# Patient Record
Sex: Female | Born: 1960
Health system: Southern US, Community
[De-identification: ages and names within clinical notes are randomized; demographics above are authoritative.]

## PROBLEM LIST (undated history)

## (undated) DIAGNOSIS — G8929 Other chronic pain: Secondary | ICD-10-CM

## (undated) DIAGNOSIS — M329 Systemic lupus erythematosus, unspecified: Secondary | ICD-10-CM

## (undated) DIAGNOSIS — G43909 Migraine, unspecified, not intractable, without status migrainosus: Secondary | ICD-10-CM

## (undated) DIAGNOSIS — K219 Gastro-esophageal reflux disease without esophagitis: Secondary | ICD-10-CM

## (undated) DIAGNOSIS — F32A Depression, unspecified: Secondary | ICD-10-CM

## (undated) DIAGNOSIS — D649 Anemia, unspecified: Secondary | ICD-10-CM

## (undated) DIAGNOSIS — M797 Fibromyalgia: Secondary | ICD-10-CM

## (undated) DIAGNOSIS — Z87442 Personal history of urinary calculi: Secondary | ICD-10-CM

## (undated) DIAGNOSIS — F419 Anxiety disorder, unspecified: Secondary | ICD-10-CM

## (undated) DIAGNOSIS — IMO0002 Reserved for concepts with insufficient information to code with codable children: Secondary | ICD-10-CM

## (undated) DIAGNOSIS — F329 Major depressive disorder, single episode, unspecified: Secondary | ICD-10-CM

## (undated) DIAGNOSIS — R112 Nausea with vomiting, unspecified: Secondary | ICD-10-CM

## (undated) DIAGNOSIS — G4730 Sleep apnea, unspecified: Secondary | ICD-10-CM

## (undated) DIAGNOSIS — Z9889 Other specified postprocedural states: Secondary | ICD-10-CM

## (undated) HISTORY — DX: Other chronic pain: G89.29

## (undated) HISTORY — DX: Depression, unspecified: F32.A

## (undated) HISTORY — PX: CARDIAC CATHETERIZATION: SHX172

## (undated) HISTORY — PX: APPENDECTOMY: SHX54

## (undated) HISTORY — DX: Migraine, unspecified, not intractable, without status migrainosus: G43.909

## (undated) HISTORY — DX: Fibromyalgia: M79.7

## (undated) HISTORY — PX: TUBAL LIGATION: SHX77

## (undated) HISTORY — PX: OTHER SURGICAL HISTORY: SHX169

## (undated) HISTORY — PX: CHOLECYSTECTOMY: SHX55

## (undated) HISTORY — DX: Reserved for concepts with insufficient information to code with codable children: IMO0002

## (undated) HISTORY — PX: BREAST CYST ASPIRATION: SHX578

## (undated) HISTORY — PX: TONSILLECTOMY: SUR1361

## (undated) HISTORY — DX: Major depressive disorder, single episode, unspecified: F32.9

## (undated) HISTORY — DX: Systemic lupus erythematosus, unspecified: M32.9

---

## 1999-04-21 ENCOUNTER — Inpatient Hospital Stay (HOSPITAL_COMMUNITY): Admission: AD | Admit: 1999-04-21 | Discharge: 1999-04-22 | Payer: Self-pay | Admitting: Internal Medicine

## 2001-02-07 ENCOUNTER — Emergency Department (HOSPITAL_COMMUNITY): Admission: EM | Admit: 2001-02-07 | Discharge: 2001-02-07 | Payer: Self-pay | Admitting: Emergency Medicine

## 2001-05-13 ENCOUNTER — Ambulatory Visit (HOSPITAL_COMMUNITY): Admission: RE | Admit: 2001-05-13 | Discharge: 2001-05-13 | Payer: Self-pay | Admitting: Internal Medicine

## 2001-05-13 ENCOUNTER — Encounter: Payer: Self-pay | Admitting: Internal Medicine

## 2001-05-17 ENCOUNTER — Encounter: Admission: RE | Admit: 2001-05-17 | Discharge: 2001-08-15 | Payer: Self-pay

## 2001-05-26 ENCOUNTER — Encounter (HOSPITAL_COMMUNITY): Admission: RE | Admit: 2001-05-26 | Discharge: 2001-06-25 | Payer: Self-pay | Admitting: Neurosurgery

## 2001-06-07 ENCOUNTER — Encounter: Payer: Self-pay | Admitting: Internal Medicine

## 2001-06-07 ENCOUNTER — Ambulatory Visit (HOSPITAL_COMMUNITY): Admission: RE | Admit: 2001-06-07 | Discharge: 2001-06-07 | Payer: Self-pay | Admitting: Internal Medicine

## 2001-06-28 ENCOUNTER — Encounter (HOSPITAL_COMMUNITY): Admission: RE | Admit: 2001-06-28 | Discharge: 2001-07-28 | Payer: Self-pay | Admitting: Neurosurgery

## 2001-09-12 ENCOUNTER — Encounter: Admission: RE | Admit: 2001-09-12 | Discharge: 2001-12-11 | Payer: Self-pay

## 2001-11-16 ENCOUNTER — Encounter: Payer: Self-pay | Admitting: Internal Medicine

## 2001-11-16 ENCOUNTER — Ambulatory Visit (HOSPITAL_COMMUNITY): Admission: RE | Admit: 2001-11-16 | Discharge: 2001-11-16 | Payer: Self-pay | Admitting: Cardiology

## 2001-11-23 ENCOUNTER — Encounter: Payer: Self-pay | Admitting: Internal Medicine

## 2001-11-23 ENCOUNTER — Ambulatory Visit (HOSPITAL_COMMUNITY): Admission: RE | Admit: 2001-11-23 | Discharge: 2001-11-23 | Payer: Self-pay | Admitting: Internal Medicine

## 2002-01-11 ENCOUNTER — Encounter: Admission: RE | Admit: 2002-01-11 | Discharge: 2002-04-11 | Payer: Self-pay

## 2002-02-10 ENCOUNTER — Ambulatory Visit (HOSPITAL_COMMUNITY): Admission: RE | Admit: 2002-02-10 | Discharge: 2002-02-10 | Payer: Self-pay | Admitting: Internal Medicine

## 2002-03-15 ENCOUNTER — Ambulatory Visit (HOSPITAL_COMMUNITY): Admission: RE | Admit: 2002-03-15 | Discharge: 2002-03-15 | Payer: Self-pay | Admitting: Internal Medicine

## 2002-03-15 ENCOUNTER — Encounter: Payer: Self-pay | Admitting: Internal Medicine

## 2002-03-22 ENCOUNTER — Encounter (INDEPENDENT_AMBULATORY_CARE_PROVIDER_SITE_OTHER): Payer: Self-pay | Admitting: Internal Medicine

## 2002-03-22 ENCOUNTER — Ambulatory Visit (HOSPITAL_COMMUNITY): Admission: RE | Admit: 2002-03-22 | Discharge: 2002-03-22 | Payer: Self-pay | Admitting: Internal Medicine

## 2002-05-27 ENCOUNTER — Emergency Department (HOSPITAL_COMMUNITY): Admission: EM | Admit: 2002-05-27 | Discharge: 2002-05-27 | Payer: Self-pay | Admitting: Emergency Medicine

## 2002-05-27 ENCOUNTER — Encounter: Payer: Self-pay | Admitting: Emergency Medicine

## 2002-07-31 ENCOUNTER — Encounter: Payer: Self-pay | Admitting: *Deleted

## 2002-07-31 ENCOUNTER — Ambulatory Visit (HOSPITAL_COMMUNITY): Admission: RE | Admit: 2002-07-31 | Discharge: 2002-07-31 | Payer: Self-pay | Admitting: *Deleted

## 2002-08-04 ENCOUNTER — Ambulatory Visit (HOSPITAL_COMMUNITY): Admission: RE | Admit: 2002-08-04 | Discharge: 2002-08-04 | Payer: Self-pay | Admitting: General Surgery

## 2002-11-15 ENCOUNTER — Ambulatory Visit (HOSPITAL_COMMUNITY): Admission: RE | Admit: 2002-11-15 | Discharge: 2002-11-15 | Payer: Self-pay | Admitting: Internal Medicine

## 2002-11-15 ENCOUNTER — Encounter: Payer: Self-pay | Admitting: Internal Medicine

## 2002-12-26 ENCOUNTER — Encounter: Payer: Self-pay | Admitting: Family Medicine

## 2002-12-26 ENCOUNTER — Ambulatory Visit (HOSPITAL_COMMUNITY): Admission: RE | Admit: 2002-12-26 | Discharge: 2002-12-26 | Payer: Self-pay | Admitting: Family Medicine

## 2003-01-29 ENCOUNTER — Encounter: Payer: Self-pay | Admitting: Internal Medicine

## 2003-01-29 ENCOUNTER — Ambulatory Visit (HOSPITAL_COMMUNITY): Admission: RE | Admit: 2003-01-29 | Discharge: 2003-01-29 | Payer: Self-pay | Admitting: Internal Medicine

## 2003-01-30 ENCOUNTER — Ambulatory Visit (HOSPITAL_COMMUNITY): Admission: RE | Admit: 2003-01-30 | Discharge: 2003-01-30 | Payer: Self-pay | Admitting: Internal Medicine

## 2003-01-30 ENCOUNTER — Encounter: Payer: Self-pay | Admitting: Internal Medicine

## 2003-10-29 ENCOUNTER — Ambulatory Visit (HOSPITAL_COMMUNITY): Admission: RE | Admit: 2003-10-29 | Discharge: 2003-10-29 | Payer: Self-pay | Admitting: *Deleted

## 2004-02-20 ENCOUNTER — Ambulatory Visit: Payer: Self-pay | Admitting: Cardiology

## 2004-02-20 ENCOUNTER — Ambulatory Visit (HOSPITAL_COMMUNITY): Admission: RE | Admit: 2004-02-20 | Discharge: 2004-02-20 | Payer: Self-pay | Admitting: Cardiology

## 2004-03-24 ENCOUNTER — Ambulatory Visit: Payer: Self-pay | Admitting: Cardiology

## 2004-12-23 ENCOUNTER — Ambulatory Visit: Payer: Self-pay | Admitting: Internal Medicine

## 2004-12-24 ENCOUNTER — Ambulatory Visit (HOSPITAL_COMMUNITY): Admission: RE | Admit: 2004-12-24 | Discharge: 2004-12-24 | Payer: Self-pay | Admitting: Internal Medicine

## 2005-01-15 ENCOUNTER — Ambulatory Visit (HOSPITAL_COMMUNITY): Admission: RE | Admit: 2005-01-15 | Discharge: 2005-01-15 | Payer: Self-pay | Admitting: Internal Medicine

## 2005-01-15 ENCOUNTER — Ambulatory Visit: Payer: Self-pay | Admitting: Internal Medicine

## 2005-07-16 ENCOUNTER — Ambulatory Visit (HOSPITAL_COMMUNITY): Admission: RE | Admit: 2005-07-16 | Discharge: 2005-07-16 | Payer: Self-pay | Admitting: *Deleted

## 2005-08-12 ENCOUNTER — Ambulatory Visit (HOSPITAL_COMMUNITY): Admission: RE | Admit: 2005-08-12 | Discharge: 2005-08-12 | Payer: Self-pay | Admitting: *Deleted

## 2007-01-28 ENCOUNTER — Ambulatory Visit (HOSPITAL_COMMUNITY): Admission: RE | Admit: 2007-01-28 | Discharge: 2007-01-28 | Payer: Self-pay | Admitting: Internal Medicine

## 2007-01-28 ENCOUNTER — Encounter: Payer: Self-pay | Admitting: Orthopedic Surgery

## 2007-02-21 ENCOUNTER — Ambulatory Visit (HOSPITAL_COMMUNITY): Admission: RE | Admit: 2007-02-21 | Discharge: 2007-02-21 | Payer: Self-pay | Admitting: Obstetrics & Gynecology

## 2007-03-16 ENCOUNTER — Ambulatory Visit: Payer: Self-pay | Admitting: Orthopedic Surgery

## 2007-03-16 DIAGNOSIS — M25569 Pain in unspecified knee: Secondary | ICD-10-CM | POA: Insufficient documentation

## 2007-03-16 DIAGNOSIS — IMO0001 Reserved for inherently not codable concepts without codable children: Secondary | ICD-10-CM | POA: Insufficient documentation

## 2007-03-16 DIAGNOSIS — M549 Dorsalgia, unspecified: Secondary | ICD-10-CM | POA: Insufficient documentation

## 2007-03-21 ENCOUNTER — Encounter: Payer: Self-pay | Admitting: Orthopedic Surgery

## 2007-03-21 ENCOUNTER — Encounter (HOSPITAL_COMMUNITY): Admission: RE | Admit: 2007-03-21 | Discharge: 2007-04-13 | Payer: Self-pay | Admitting: Orthopedic Surgery

## 2007-04-20 ENCOUNTER — Encounter (HOSPITAL_COMMUNITY): Admission: RE | Admit: 2007-04-20 | Discharge: 2007-05-20 | Payer: Self-pay | Admitting: Orthopedic Surgery

## 2007-05-04 ENCOUNTER — Encounter: Payer: Self-pay | Admitting: Orthopedic Surgery

## 2007-05-12 ENCOUNTER — Encounter: Payer: Self-pay | Admitting: *Deleted

## 2007-05-13 ENCOUNTER — Ambulatory Visit (HOSPITAL_COMMUNITY): Admission: RE | Admit: 2007-05-13 | Discharge: 2007-05-13 | Payer: Self-pay | Admitting: *Deleted

## 2007-08-24 ENCOUNTER — Ambulatory Visit (HOSPITAL_COMMUNITY): Admission: RE | Admit: 2007-08-24 | Discharge: 2007-08-24 | Payer: Self-pay | Admitting: Internal Medicine

## 2009-06-12 ENCOUNTER — Ambulatory Visit (HOSPITAL_COMMUNITY): Admission: RE | Admit: 2009-06-12 | Discharge: 2009-06-12 | Payer: Self-pay | Admitting: Obstetrics & Gynecology

## 2010-05-04 ENCOUNTER — Encounter: Payer: Self-pay | Admitting: Family Medicine

## 2010-05-04 ENCOUNTER — Encounter: Payer: Self-pay | Admitting: Obstetrics & Gynecology

## 2010-08-26 NOTE — Cardiovascular Report (Signed)
[name], [name]               ACCOUNT NO.:  [id number]   MEDICAL RECORD NO.:  [id number]          PATIENT TYPE:  OIB   LOCATION:  2867                         FACILITY:  [facility]   PHYSICIAN:  Darlin Priestly, MD  DATE OF BIRTH:  [birth year]   DATE OF PROCEDURE:  05/13/2007  DATE OF DISCHARGE:  05/13/2007                            CARDIAC CATHETERIZATION   PROCEDURE:  1. Left heart catheterization.  2. Coronary angiography.  3. Left ventriculogram.   SURGEON:  Darlin Priestly, MD   COMPLICATIONS:  None.   INDICATIONS:  [name] is a 50 year old female patient of Dr. Kem Boroughs and Dr. Madelin Rear. Fusco with a history of ongoing chest pain  and shortness of breath.  She has been under a considerable amount of  depression.  She did undergo a Cardiolite scan suggesting intraseptal  ischemia.  She is now referred for cardiac catheterization to rule out  significant CAD.   DESCRIPTION OF PROCEDURE:  After giving informed consent the patient was  brought to the cardiac cath lab.  The right groin was shaved, prepped  and draped in the usual sterile fashion.  Routine monitoring  established.  Using modified Seldinger technique a #6 anterior sheath  was inserted in the femoral artery.  A 6 French diagnostic catheter was  performed diagnostic angiography.   Left main is a large vessel disease and no significant disease.   The LAD is a large vessel coursing at the apex and gives off one  diagonal branch.  The LAD has no significant disease.   First diagonal was a small to medium sized vessel with no significant  disease.   Left circumflex was a medium sized vessel coursing in the AV groove and  gives off one obtuse marginal branch.  The AV circumflex has no  significant disease.   First OM is a medium sized vessel which bifurcates in the segment with  no significant disease.   The right coronary is a large vessel which was dominant.  It gives off a  PDA  posterolateral branch.  There was no significant disease in the RCA  or posterolateral branch.   Left ventriculogram reveals a preserved EF of 60%.   HEMODYNAMICS:  Systemic arterial pressure 105/62, LV systemic pressure  105/6, LVEDP of 10.   CONCLUSION:  No significant coronary artery disease.  Normal left  ventricular systolic function.      Darlin Priestly, MD  Electronically Signed     RHM/MEDQ  D:  05/13/2007  T:  05/13/2007  Job:  [id number]   cc:   Kem Boroughs, Dr

## 2010-08-29 NOTE — Op Note (Signed)
[name], [name]                           ACCOUNT NO.:  [id number]   MEDICAL RECORD NO.:  [id number]                   PATIENT TYPE:   LOCATION:                                       FACILITY:   PHYSICIAN:  Lionel December, M.D.                 DATE OF BIRTH:   DATE OF PROCEDURE:  02/10/2002  DATE OF DISCHARGE:                                 OPERATIVE REPORT   PROCEDURE:  Esophagogastroduodenoscopy.   ENDOSCOPIST:  Lionel December, M.D.   INDICATIONS:  This patient is a 50 year old Caucasian female who has a 3-  month history of intermittent epigastric pain associated with nausea and  vomiting.  Workup for gallbladder disease has been negative.  She has not  responded to PPI.  She does have throat symptoms.  She was treated for H.  pylori 1 year ago.   She is undergoing diagnostic EGD.  The procedure and risks were reviewed  with the patient and informed consent was obtained.   PREOPERATIVE MEDICATIONS:  Cetacaine spray for pharyngeal topical  anesthesia, Fentanyl 50 mcg IV and Versed 6 mg IV in divided dose.   INSTRUMENT:  Olympus video system.   FINDINGS:  Procedure performed in endoscopy suite.  The patient's vital  signs and O2 saturation were monitored during the procedure and remained  stable.  The patient was placed in the left lateral recumbent position and  endoscope was passed via the oropharynx without any difficulty into the  esophagus.   ESOPHAGUS:  Mucosa of the esophagus was normal throughout.  Squamocolumnar  junction was wavy.  There was a small sliding hiatal hernia.   STOMACH:  It was empty and distended very well with insufflation.  The folds  of the proximal stomach were normal.  Examination of the mucosa, body,  antrum, pyloric channel, as well as angularis and fundus were normal.   DUODENUM:  Examination of the bulb, second and third part of the duodenum  was also normal.   Endoscope was withdrawn.  The patient tolerated the procedure well.   FINAL DIAGNOSES:  1. Small sliding hiatal hernia at gastroesophageal junction.  2. No evidence of peptic ulcer disease or gastritis.   RECOMMENDATIONS:  1. She will continue antireflux measures as before.  We will drop her Nexium     to 40 mg q.a.m. and try her on     Carafate 2 gm at bedtime.  2. I will ask her to keep a written record as to the frequency of these     episodes and she will return for follow up visit 1 month from now.                                               Lionel December, M.D.  NR/MEDQ  D:  02/10/2002  T:  02/10/2002  Job:  [id number]   cc:   Madelin Rear. Sherwood Gambler, M.D.  [address]  [place]  [place] [address]  Fax: [phone]

## 2010-08-29 NOTE — Consult Note (Signed)
[name], [name]                         ACCOUNT NO.:  [id number]   MEDICAL RECORD NO.:  [id number]                   PATIENT TYPE:   LOCATION:                                       FACILITY:   PHYSICIAN:  Zachary George, DO                      DATE OF BIRTH:  Feb 23, 1961   DATE OF CONSULTATION:  04/11/2002  DATE OF DISCHARGE:                                   CONSULTATION   HISTORY OF PRESENT ILLNESS:  The patient returns to the clinic today for  reevaluation.  She was last seen on March 13, 2002.  In the interim, her  pain has decreased significantly.  She is doing much better in that regard.  Her pain today is a 3/10 on a subjective scale.  I have prescribed Klonopin  for sleep, which she states has been helpful, although she continues to only  sleep approximately five hours per night.  She follows up with per primary  care Kasen Adduci tomorrow.  Also in the interim, she has undergone MRI of her  cervical spine and brain.  I do not have the results of this, but she states  that the cervical spine MRI showed some bulging disks.  However, she is not  complaining of any neck pain.  She states that [employer]. Fusco, M.D., had  considered physical therapy for her neck, but she does not feel like she  needs it.  She has also undergone electrodiagnostic studies of bilateral  lower extremities in the interim.  I do not have results of these studies,  however, the patient states that there was nothing remarkable noted on nerve  conduction studies or EMG.  She states that Dr. Sherwood Gambler wants to refer her  back to the neurologist to evaluate the MRI of her brain.  I reviewed the  health and history form and 14-point review of systems.  She continues on  Klonopin 0.5 mg q.h.s. and Tylox typically every two to three days, however,  she does not feel like she needs this any further.  She continues with her  home exercise program.   PHYSICAL EXAMINATION:  GENERAL APPEARANCE:  A healthy female in  no acute  distress.  VITAL SIGNS:  Blood pressure 121/60, pulse 71, respirations 20, O2  saturation 99% on room air.  NEUROLOGIC:  Intact in the upper and lower extremities, including motor,  sensory, and reflexes.  Gait is normal.   IMPRESSION:  1. Fibromyalgia syndrome exacerbation, resolved.  2. History of lupus.   PLAN:  1. Will continue with Klonopin, but increase the dose to 1 mg one p.o.     q.h.s. as needed for one more month.  At that time, would consider     switching back to a low-dose tricyclic antidepressant.  Would chose     Pamelor and monitor for side effects.  The patient was on Elavil  previously, which helped her significantly, but also caused her to gain     weight.  Pamelor has a lower side effect profile in that regard.  2. Continue home exercise program.  3. Follow up with primary care Naliah Eddington.  4. The patient is to return to the clinic on an as needed basis.   The patient was educated on the above findings and recommendations and  understands.  There were no barriers to communication.                                               Zachary George, DO    JW/MEDQ  D:  04/11/2002  T:  04/11/2002  Job:  [id number]   cc:   Madelin Rear. Sherwood Gambler, M.D.  [address]  [place]  [place] [address]  Fax: [phone]

## 2010-08-29 NOTE — Consult Note (Signed)
   [name], [name]                         ACCOUNT NO.:  [id number]   MEDICAL RECORD NO.:  [id number]                   PATIENT TYPE:  REC   LOCATION:  [facility]                                  FACILITY:  [facility]   PHYSICIAN:  Sondra Come, D.O.                 DATE OF BIRTH:  1961/03/12   DATE OF CONSULTATION:  01/12/2002  DATE OF DISCHARGE:                                   CONSULTATION   REASON FOR CONSULTATION:  The patient returns to clinic today for re-  evaluation.  She was last seen on 09/19/01.  She states she had been doing  very well in terms of her overall pain management until she went on a trip  to [place] approximately one month ago.  She states that she had a very  difficult time sleeping while she was away, and has had a flare-up of her  fibromyalgic symptoms.  Initially, she had pain diffusely involving the  upper back, hips, legs, and knees.                                               Sondra Come, D.O.    JJW/MEDQ  D:  01/12/2002  T:  01/13/2002  Job:  [id number]

## 2010-08-29 NOTE — Consult Note (Signed)
[facility]  Patient:    ANAIRA, SEAY Visit Number: [id number] MRN: [id number]          Service Type: PMG Location: [facility] Attending Physician:  Sondra Come Dictated by:   Sondra Come, D.O. Proc. Date: 09/19/01 Admit Date:  09/12/2001   CC:         Elfredia Nevins, M.D.   Consultation Report  HISTORY:  The patient returns to the clinic today scheduled for reevaluation. She was last seen on July 20, 2001. She continues to do quite well in terms of her fibromyalgic pain. Currently her pain is 3/10 on a subjective scale. She states that over the last few weeks her hips, legs and back have been hurting more significantly. She has been under a significant amount of stress over the last week or so as her brother-in-law apparently died as a result of an overdose of OxyContin. She has been dealing with that and other stressful family issues. Her function and quality of life indices, however,  have improved significantly. Her sleep is good on Elavil 25 mg daily. She continues also on Neurontin 300 mg t.i.d., Humibid DM b.i.d. and Ultracet as needed. I gave her 30 Ultracet samples at last visit and she had used those sparingly and in fact just ran out. I reviewed the health and history form and 14 point review of systems.  PHYSICAL EXAMINATION:  Reveals a healthy female in no acute distress. Blood pressure 117/56, pulse 85, respirations 16, O2 saturations 99% on room air. Palpatory examination reveals minimal tenderness to palpation, bilateral gluteal muscles. She does have some mild tenderness over the left greater trochanter. Minimal lumbar paraspinal tenderness. No neurologic deficits noted in the upper and lower extremities including motor, sensory and reflexes at this time.  IMPRESSION:  1. Fibromyalgia syndrome.  2. History of lupus.  3. Nonrestorative sleep disorder improved.  PLAN:  1. Ms. Cage and I discussed further treatment options  at this point.     I instructed her to continue Elavil 25 mg at bedtime for now. Will     consider transitioning her to another agent over time as there is some     concern for weight gain.  2. Continue Neurontin for now but consider weaning the patient from this     to determine whether or not she still needs to be on it. I have instructed     her to discontinue this slowly over a week prior to her next visit to see     if she needs to continue. Would consider switching her to Zonegran     which would likely give her the same benefit with potential for weight     loss.  3. Continue Humibid DM.  4. Continue Ultracet 1-2 p.o. t.i.d. p.r.n. #60 with one refill.  5. Would continue home exercise program especially low to nonimpact aerobic     activity.  6. The patient to return to clinic in one month for reevaluation.  The patient was educated on the above findings and recommendations and understands. There were no barriers to communication. Dictated by:   Sondra Come, D.O. Attending Physician:  Sondra Come DD:  09/19/01 TD:  09/21/01 Job: [id number]

## 2010-08-29 NOTE — Consult Note (Signed)
[name], [name]                         ACCOUNT NO.:  [id number]   MEDICAL RECORD NO.:  [id number]                   PATIENT TYPE:  OUT   LOCATION:  RAD                                  FACILITY:  [facility]   PHYSICIAN:  Sondra Come, D.O.                 DATE OF BIRTH:  04-03-1961   DATE OF CONSULTATION:  03/13/2002  DATE OF DISCHARGE:                                   CONSULTATION   REASON FOR CONSULTATION:  The patient returns to the clinic today as  scheduled for reevaluation.  She was last seen on January 12, 2002.  In the  interim, she had been doing fairly well until about two weeks ago when she  had a flare-up of her fibromyalgia symptoms.  She notes increased stress  over the holiday.  Her pain is increased diffusely but mainly involving the  mid to low back and lower extremities with an intermittent with burning-type  pain.  She was evaluated by her primary care physician last week who  performed blood work including B12, folate, and a sedimentation rate which  according to the patient were normal.  He also has requested MRI of her  brain and cervical spine per patient's report.  These are to be done in the  next week or two.  She also was referred for electrodiagnostic studies of  her lower extremities.  She was given a prescription for Tylox, which she  takes the edge off, but her face feel somewhat numb.  The Ultram, which she  previously took, is no longer working for her.  She feels like her sleep is  fairly good with Ambien at bed time; however, she does not feel well rested.  She continues exercise including stretching and walking.   Her lower extremity symptoms seemed to have improved somewhat compared to  last week per her report.  Her pain today is an 8 over 10 on subjective  scale.  Functional quality of life indices have declined.  She is planning  on a trip to [place] in the next few weeks and I think there is a lot of  stress in this regard too as  traveling tends to disrupt her sleep and she is  somewhat worried about this.  She continues on Humibid DM without any  significant improvement.  She also does not note any significant improvement  with Zanaflex which she takes just as needed.  She has started Carafate and  Nexium per gastroenterology secondary to a hiatal hernia with questionable  ulcer.  I reviewed health and history form and 14-point review of systems.   PHYSICAL EXAMINATION:  GENERAL:  A healthy female in no acute distress.  VITAL SIGNS:  Blood pressure 119/71, pulse 99, respirations 18.  O2  saturations 100% on room air.  BACK:  Examination of the back reveals level pelvis without scoliosis.  There is tenderness to palpation  in the thoracolumbar paraspinal muscles  reproducing the patient's back pain symptoms.  Manual muscle testing is 5/5  bilateral upper and lower extremities.  Sensory examination is intact to  light touch bilateral upper and lower extremities.  Muscle stretch reflexes  are 2+/4 right patellar, bilateral medial hamstrings, and bilateral Achilles  and it is 3+/4 left patellar.  There is no ankle clonus noted.  Straight leg  raises, faber and Lhermitte's signs are negative.  Minimal tenderness to  palpation at the medial knees bilaterally.  There is some tenderness over  the greater trochanters and gluteal muscles bilaterally as well.   IMPRESSION:  1. Fibromyalgia syndrome.  2. History of lupus.  3. Mild hyperreflexia left patella reflex of uncertain etiology.   PLAN:  1. Discontinue Ambien and begin Klonopin 0.5 mg 1 p.o. q.h.s. p.r.n. #30     without refills.  2. Discontinue Zanaflex.  3. Await MRI of brain and C-spine which is appropriate to rule out any upper     motor neuron problem.  4. It would be reasonable to postpone electrodiagnostic studies as these     would not show any upper motor neuron lesion, and based on the patient's     examination today, I do not feel like she has a  peripheral neuropathy or     radiculopathy which would be detected by nerve conduction studies and     EMG.  5. Discontinue Humibid.  6. Continue home exercise program.  7. Offered reassurance as I feel this is a fibromyalgia flare-up and should     likely respond to conservative measures, although again I think it is     reasonable to proceed with MRIs to rule out any upper motor neuron     component.  8. The patient should return to the clinic in one month for reevaluation.   DISPOSITION:  The patient was educated in the above findings,  recommendations, and understands.  There were no barriers to communication.                                               Sondra Come, D.O.    JJW/MEDQ  D:  03/13/2002  T:  03/13/2002  Job:  [id number]   cc:   Madelin Rear. Sherwood Gambler, M.D.  [address]  [place]  [place] [address]  Fax: [phone]

## 2010-08-29 NOTE — Consult Note (Signed)
NAMEVYLA, PINT                         ACCOUNT NO.:  [id number]   MEDICAL RECORD NO.:  [id number]                   PATIENT TYPE:  REC   LOCATION:  [facility]                                  FACILITY:  [facility]   PHYSICIAN:  Sondra Come, D.O.                 DATE OF BIRTH:  1960/10/07   DATE OF CONSULTATION:  01/12/2002  DATE OF DISCHARGE:                                   CONSULTATION   REASON FOR CONSULTATION:  The patient returns to clinic today as scheduled  for re-evaluation.  She was last seen on 09/19/01.  She had been doing very  well with her overall pain management until she took a trip to [place]  approximately one month ago.  She states she had a very difficult time  sleeping while she was on that trip, and noticed a flare-up in her pain  diffusely involving her upper back, hips, legs, and knees.  She has been  diagnosed with lupus by her primary care physician who has given her a  Decadron shot and steroid taper in the interim, which she states has helped  her upper back pain, but she continues to have pain in her hips, legs, and  knees which she describes as achy and burning.  Her sleep is still very poor  despite trazodone, which she state helps her fall asleep, but she wakes up  after a few hours.  She has also noticed increased nightmares over the past  few months, and I am wondering whether or not this is related to the  trazodone.  She continues to take Ultracet up to two pills t.i.d. which does  not seem to be helping as much as it previously had.  She also continues on  Humibid DM which she feels is helping her symptoms to some degree.  This is  likely from the dextromethorphan component.  I reviewed health and history  form and 14 point review of systems.  The patient denies any numbness or  paresthesias in the upper and lower extremities.  She does admit to a cold  feeling of her feet bilaterally.   PHYSICAL EXAMINATION:  GENERAL:  A healthy female in no acute  distress.  VITAL SIGNS:  Blood pressure 121/55, pulse 78, respirations 20, O2  saturation 98% on room air.  NEUROLOGIC:  Manual muscle testing is 5/5 bilateral upper and lower  extremities.  Sensory examination is intact to light touch bilateral upper  and lower extremities.  Muscle stretch reflexes are 2+/4 bilateral biceps,  triceps, brachial radialis, pronator teres, patellar, medial hamstrings, and  Achilles.  Palpatory examination reveals mild tenderness to palpation  bilateral gluteal muscles, as well as the lumbar paraspinal muscles.  There  is mild tenderness over the medial knees.   IMPRESSION:  1. Fibromyalgia syndrome.  2. History of lupus.  3. Non-restorative sleep disorder.   PLAN:  1. I will  begin Ambien 10 mg 1/2 to 1 p.o. q.h.s. p.r.n. insomnia #30 with     one refill.  The patient was educated on the use of this medication.  I     think with restorative sleep capacity, her symptoms should begin to     subside.  2. I will prescribe Lidoderm 5% apply up to 12 hours per day #30 with one     refill.  The patient was also given two sample patches.  3. Change Ultracet to Ultram 50 mg one or two p.o. q.i.d. p.r.n. #100 with     one refill.  The patient states that she believes the Ultram seemed to     help her more then the Ultracet.  4. Continue Humibid DM one p.o. b.i.d. #60 with one refill.  5. Await aquatic therapy which also should help in terms of overall pain     management.  6. The patient is to return to clinic in two months or sooner as needed.   The patient was educated on the above findings and recommendations and  understands.  There were no barriers to communication.                                               Sondra Come, D.O.    JJW/MEDQ  D:  01/12/2002  T:  01/13/2002  Job:  [id number]

## 2010-08-29 NOTE — Op Note (Signed)
[name], [name]                         ACCOUNT NO.:  [id number]   MEDICAL RECORD NO.:  [id number]                   PATIENT TYPE:  AMB   LOCATION:  DAY                                  FACILITY:  [facility]   PHYSICIAN:  Dalia Heading, M.D.               DATE OF BIRTH:  1960-08-16   DATE OF PROCEDURE:  08/04/2002  DATE OF DISCHARGE:                                 OPERATIVE REPORT   PREOPERATIVE DIAGNOSIS:  Cholecystectomy, cholelithiasis.   POSTOPERATIVE DIAGNOSIS:  Cholecystectomy, cholelithiasis.   PROCEDURE:  Laparoscopic cholecystectomy.   SURGEON:  Dalia Heading, M.D.   ASSISTANT:  Bernerd Limbo. Leona Carry, M.D.   ANESTHESIA:  General endotracheal.   INDICATIONS FOR PROCEDURE:  The patient is a 50 year old white female who  was referred for evaluation and treatment of biliary colic secondary to  cholelithiasis.  The risks and benefits of the procedure, including  bleeding, infection, hepatobiliary injury, and the possibility of an open  procedure were fully explained to the patient who gave informed consent.   DESCRIPTION OF PROCEDURE:  The patient was placed in the supine position.  After induction of general endotracheal anesthesia, the abdomen was prepped  and draped using the usual sterile technique with Betadine.  Surgical site  confirmation was performed.   An infraumbilical incision was made down to the fascia.  A Veress needle was  introduced into the abdominal cavity, and confirmation of placement was done  using the saline drop test.  The abdomen was then insufflated to 16 mmHg  pressure.  An 11-mm trocar was introduced into the abdominal cavity under  direct visualization without difficulty.  The patient was placed in reverse  Trendelenburg position, and an additional 11-mm trocar was placed in the  epigastric region and 5-mm trocars were placed in the right upper quadrant  and right flank regions.  The liver was inspected and noted to be within  normal  limits.  The gallbladder was retracted superiorly and laterally.  The  dissection was begun around the infundibulum of the gallbladder.  The cystic  duct was first identified.  It's juncture to the infundibulum was fully  identified.  Endoclips were placed proximally and distally on the cystic  duct, and the cystic duct was divided.  This was likewise done on the cystic  artery.  The gallbladder was then freed away from the gallbladder fossa  using Bovie electrocautery.  The gallbladder was delivered through the  epigastric trocar site without difficulty.  The gallbladder fossa was  inspected, and no abnormal bleeding or bile leakage was noted.  Surgicel was  placed in the gallbladder fossa.  The subhepatic space as well as the  abdominal cavity were then evacuated of all fluid and air prior to removal  of the trocars.   All wounds were irrigated with normal saline.  All wounds were injected with  0.5% Sensorcaine.  The infraumbilical fascia was  reapproximated using an 0  Vicryl interrupted suture.  All skin incisions were closed using staples.  Betadine ointment and dry sterile dressings were applied.   All tape and needle counts were correct at the end of the procedure.  The  patient was extubated in the operating room and went back to the recovery  room awake and in stable condition.   COMPLICATIONS:  None.   SPECIMENS:  Gallbladder.   ESTIMATED BLOOD LOSS:  Minimal.                                               Dalia Heading, M.D.    MAJ/MEDQ  D:  08/04/2002  T:  08/04/2002  Job:  [id number]   cc:   Madelin Rear. Sherwood Gambler, M.D.  [address]  [place]  [place] [address]  Fax: [phone]   Lionel December, M.D.  [address]  [place]  [place] [address]  Fax: [phone]

## 2010-08-29 NOTE — Cardiovascular Report (Signed)
[place]. [facility]  Patient:    [name], [name]                        MRN: [id number] Proc. Date: 04/21/99 Attending:  Rollene Rotunda, M.D. [facility] CC:         Dr. Sherwood Gambler                        Cardiac Catheterization  DATE OF BIRTH:  1960-04-28  PRIMARY PHYSICIAN:  Dr. Sherwood Gambler.  PROCEDURE:  Left heart cardiac catheterization, coronary arteriography.  INDICATIONS:  Evaluate patient with chest pain (786.51) and multiple cardiovascular risk factors.  DESCRIPTION OF PROCEDURE:  Left heart catheterization was performed via the right femoral artery.  The artery was cannulated using an anterior wall puncture.  A 6 French arterial sheath was inserted via the modified Seldinger technique. Preformed Judkins and a pigtail catheter were utilized.  The patient tolerated he procedure well and left the lab in stable condition.  RESULTS:  HEMODYNAMICS:  LV 103/11, AO 105/58.  CORONARY ARTERIOGRAPHY:  Left main:  The left main coronary artery was normal.  Left anterior descending:  The LAD was normal.  Circumflex:  The circumflex coronary artery was normal.  Right coronary artery:  The right coronary artery was a dominant vessel and was  normal.  LEFT VENTRICULOGRAM:  The left ventriculogram was obtained in the RAO projection. The EF was 65% with normal wall motion.  It was somewhat difficult to access secondary to ventricular ectopy during injection.  CONCLUSION:  Normal coronary arteries and normal left ventricular function.  PLAN:  No further cardiovascular testing is suggested at this point.  Should the patient continue to have chest discomfort she will be followed by her primary care physician to work-up non-anginal etiologies. DD:  04/21/99 TD:  04/22/99 Job: [id number]

## 2010-08-29 NOTE — Consult Note (Signed)
[name], MAHAJAN                         ACCOUNT NO.:  [id number]   MEDICAL RECORD NO.:  [id number]                   PATIENT TYPE:  REC   LOCATION:  TPC                                  FACILITY:  [facility]   PHYSICIAN:  Lionel December, M.D.                 DATE OF BIRTH:  1960-06-26   DATE OF CONSULTATION:  02/01/2002  DATE OF DISCHARGE:                                   CONSULTATION   PRESENTING PROBLEM:  Recurrent epigastric pain associated with nausea and  vomiting.   HISTORY OF PRESENT ILLNESS:  The patient is a 50 year old Caucasian who is  referred through the courtesy of Dr. Sherwood Gambler for GI evaluation.  She presents  with a 3 month history of intermittent epigastric pain which is in the mid  area.  It wakes her up in the middle of the night.  The pain is described to  be sharp, intractable, and associated with nausea and vomiting.  It may last  anywhere from a few minutes to several hours.  She had a normal upper  abdominal ultrasound and a normal hepatobiliary scan.  Her ejection fraction  was 70+%.  She was given Aciphex for 1 week which did not make any  difference.  She does complain of intermittent tickling in her throat and at  times she feels like she is strangling.  However, she denies hoarseness,  chronic cough, or dysphasia.  She experiences heartburn very occasionally.  She denies melena and has occasional hematochezia.  She still has frequent  diarrhea.  She states she has learned to live with it.  While she was on  narcotic for pain control she had normal stools.  Imodium has not provided  any relief in the past.  She previously has been diagnosed with IBS as  reviewed in the past medical history.  She also complains of muscle and  joint pain involving the lower half of her body.  This is felt to be due to  fibromyalgia and autoimmune disease.  She does not take any OTC NSAIDS.   CURRENT MEDICATIONS:  1. Vivelle-Dot 0.1 mg patch to skin twice a week.  2.  Humibid 1 b.i.d.  3. Ambien 10 mg q.h.s.  4. Zoloft 100 mg q.d.  5. Ultram 50 mg 2-3 doses q.d.  6. Tylenol Sinus p.r.n.   PAST MEDICAL HISTORY:  1. History of depression and anxiety diagnosed about 7-8 years ago.  2. Fibromyalgia.  3. She was diagnosed with lupus 2 years ago.  She has required intermittent     steroid therapy.  4. She also has been diagnosed with IBS.  She was evaluated in 9/98 for     diarrhea.  Stool studies performed by Dr. Patrica Duel were negative.     She did not respond to antispasmodic therapy and went on to have a total     colonoscopy and ileoscopy which were normal  except for a few diverticula     of the left colon, followed by a normal small bowel series.  5. She had a cardiac catheterization 2 years ago because of chest pain     radiating to her neck and left arm.  The catheterization was normal.  6. Tonsillectomy.  7. Appendectomy.  8. Tubal ligation.  9. BSO with hysterectomy last year.   ALLERGIES:  1. Penicillin causing rash.  2. Demerol resulting in headache.   FAMILY HISTORY:  Mother died 3 years ago at 39 of thrombotic problems.  She  had mitral valve and had to be off of Coumadin. Father died of an MI at age  12.  One sister has lupus and ulcerative colitis.  Other siblings are in  good health.   SOCIAL HISTORY:  She is married.  Her son has asthma.  She is a housewife.  She does not smoke cigarettes or drink alcohol.   PHYSICAL EXAMINATION:  GENERAL: A pleasant well-developed well-nourished  Caucasian female who is in no acute distress.  VITAL SIGNS: She weighs 147-1/2 pounds.  She is 5 feet 3 inches tall.  Pulse  78/minute, blood pressure 118/64.  She is afebrile.  HEENT: Conjunctivae is pink.  Sclerae is nonicteric.  Pharyngeal mucosa is  normal.  NECK: Without masses or thyromegaly.  CARDIAC: Regular rhythm.  Normal S1 S2.  No murmur or gallop noted.  LUNGS: Clear to auscultation.  ABDOMEN: Flat.  Bowel sounds are normal.   Palpation reveals soft abdomen  with moderate mid epigastric tenderness.  No organomegaly or masses.  RECTAL: Deferred.  EXTREMITIES: No clubbing or edema noted.   ASSESSMENT:  1. The patient is a 50 year old Caucasian female with multiple medical     problems with recurrent pain in the mid epigastric area associated with     nausea and vomiting.  A short course of Aciphex provided no relief.  An     ultrasound and hepatobiliary scan were negative.  While she does not have     typical heartburn she does have some throat symptoms and it is possible     that her pain is secondary to gastroesophageal reflux disease.  She had     only been on a proton pump inhibitor for 1 week which is not long enough     therapy to draw any conclusions.  She could also have peptic ulcer     disease.  2. Chronic diarrhea felt to be due to irritable bowel syndrome.  It seemed     to interfering with the quality of life and she has to go to the bathroom     multiple times.   RECOMMENDATIONS:  1. Give her Nexium 40 mg b.i.d.  Samples given.  2. We will schedule her for a diagnostic Esophagastroduodenoscopy next week.  3. The procedure is reviewed with the patient and she is agreeable.  4.     Lomotil t.i.d. p.r.n. for diarrhea.  A prescription is given for 40 without      any refills. Further recommendations will be made based on the endoscopic     findings.   I would like to thank Dr. Sherwood Gambler for giving [place] the opportunity to participate  in the care of this nice lady.  Lionel December, M.D.    NR/MEDQ  D:  02/01/2002  T:  02/01/2002  Job:  [id number]   cc:   Madelin Rear. Sherwood Gambler, M.D.  [address]  [place]  [place] [address]  Fax: [phone]

## 2010-08-29 NOTE — Consult Note (Signed)
[facility]  Patient:    [name], [name] Visit Number: [id number] MRN: [id number]          Service Type: OUT Location: RAD Attending Physician:  Cassell Smiles. Dictated by:   Sondra Come, D.O. Admit Date:  06/07/2001 Discharge Date: 06/07/2001   CC:         Elfredia Nevins, M.D.   Consultation Report  HISTORY OF PRESENT ILLNESS:  The patient returns to the clinic today as scheduled for reevaluation. She was last seen on May 19, 2001. The patient seems to be doing much better in terms of her mood and affect today. Her pain is essentially the same, rating it a 4/10. She has gotten involved in physical therapy and states that she loves it. She is becoming more active. Her function and quality of life, however, at this point, still remains about the same. Her sleep is somewhat better with Elavil 25 mg at bedtime. She states that she wakes up occasionally in the middle of the night. She also continues to take Xanax 1/2 of a 0.5 mg tab at nighttime which she states helps her sleep as well. She does not know if this is just a psychological issue with the Xanax, or if she truly needs it. We discuss this at length. It is not uncommon for somebody trying to wean themselves off of benzodiazepines to have a flare of their symptoms, and in her case, nonrestorative sleep. She is currently taking Neurontin 300 mg daily which she states has helped. She continues to express a desire to wean herself from Oxycontin, and I am supportive of this. She is currently taking 10 mg bid at this point from a previous dose of 20 mg b.i.d. She just got a 30-day supply of this yesterday from Dr. Sherwood Gambler, who also is supportive of her desire to wean this medicine. She continues to take Zoloft 100 mg daily and glucosamine and chondroitin. We discussed other therapies in our multi-directional approach to treating her pain to include behavioral health psychology. Dr. Leonides Cave has  not started seeing the patient yet primarily because she continues to see a different counsellor, and he will see her if she and her current counselor are in agreement of this. I think behavioral health is an important step in helping with the patients condition, although she does seem to be doing much better in terms of her outlook, mood, and affect today. I review health and history form and 14-point review of systems. No new neurologic complaints.  PHYSICAL EXAMINATION:  GENERAL:  A healthy female in no acute distress.  VITAL SIGNS:  Blood pressure 123/72, pulse 86, respirations 12, O2 saturation 100% on room air.  EXTREMITIES:  Manual muscle testing is 5/5 bilateral upper and lower extremities. Sensory examination is intact to light touch bilateral upper and lower extremities. Muscle stretch reflexes are 2+/4 bilateral biceps, triceps, brachioradialis, pronator teres, patellar, medial hamstrings, and Achilles. Gait is normal. The patient moves about the examination room without any difficulty.  NEUROLOGIC:  Mood and affect are bright and pleasant.  IMPRESSION: 1. Fibromyalgia syndrome which seems to be improving. 2. History of lupus. 3. Nonrestorative sleep disorder, improved.  PLAN: 1. I had a long and thorough discussion with the patient regarding    further treatment options. At this point I have instructed her to    continue on Oxycontin 10 mg b.i.d. for 2 weeks, then decrease to    10 mg q.d. x2 weeks, at which time I  will follow up with her. After    that, would consider adding a short-acting opiate such as hydrocodone    or oxycodone for further weaning of this medication. 2. Discussed the use of Xanax. I think it is reasonable for the patient    to discontinue her Xanax. She may experience some rebound insomnia,    however, this should pass. She should continue taking Elavil 25 mg    at bedtime and may even consider 50 mg at bedtime and then back down    to 25  mg. 3. Will add Humibid DM b.i.d. for the NMDA receptor antagonistic    properties of dextromethorphan. 4. Continue physical therapy. 5. The patient is to return to clinic in 1 month for reevaluation.  The patient was educated on the above findings and recommendations and understands. There were no barriers to communication. Dictated by:   Sondra Come, D.O. Attending Physician:  Cassell Smiles DD:  06/15/01 TD:  06/15/01 Job: [id number]

## 2010-08-29 NOTE — Op Note (Signed)
NAMEVENICE, MARCUCCI               ACCOUNT NO.:  [id number]   MEDICAL RECORD NO.:  [id number]          PATIENT TYPE:  AMB   LOCATION:  DAY                           FACILITY:  [facility]   PHYSICIAN:  Lionel December, M.D.    DATE OF BIRTH:  05-13-1960   DATE OF PROCEDURE:  01/15/2005  DATE OF DISCHARGE:                                 OPERATIVE REPORT   PROCEDURE:  Esophagogastroduodenoscopy.   INDICATIONS:  Latha is a 50 year old Caucasian female with recurrent  epigastric pain as well as nausea. She also has pain in the left side of her  abdomen, possibly due to IBS. She has not responded to therapy. She is on  chronic PPI therapy. She is undergoing diagnostic EGD. Procedure risks were  reviewed with the patient, and informed consent was obtained.   PREMEDICATION:  Cetacaine spray for oropharyngeal topical anesthesia.  Fentanyl 50 mcg q.d., Versed 10 mg IV in divided dose.   FINDINGS:  Procedure performed in endoscopy suite. The patient's vital signs  and O2 saturation were monitored during the procedure and remained stable.  The patient was placed in left lateral position, and Olympus videoscope was  passed via oropharynx without any difficulty into esophagus.   Esophagus. Mucosa of the esophagus was normal. GE junction was at 38 cm. No  hernia was noted.   Stomach. It was empty other than some bile in it. Distended very well with  insufflation. Folds of proximal stomach were normal. Examination of mucosa  revealed two linear streaks of erythema at antrum, but no erosions or ulcers  were noted. Pyloric channel was patent. Angularis, fundus and cardia were  examined by retroflexing the scope and were normal.   Duodenum. Bulbar mucosa was normal. Scope was passed to the second part of  the duodenum where mucosa and folds were normal. Endoscope was withdrawn.  The patient tolerated the procedure well.   FINAL DIAGNOSIS:  Nonerosive antral gastritis, otherwise normal EGD.   Wonder if  her epigastric pain is also _____________  IBS.   PLAN:  1.  H pylori serology be checked today.  2.  Dicyclomine 10 mg t.i.d. Prescription given for 60 with 1 refill.  3.  I will be contacting the patient with results of blood test and go from      there.      Lionel December, M.D.  Electronically Signed     NR/MEDQ  D:  01/15/2005  T:  01/15/2005  Job:  [id number]   cc:   Patrica Duel, M.D.  Fax: [phone]

## 2010-08-29 NOTE — Discharge Summary (Signed)
[place]. [facility]  Patient:    [name], [name]                        MRN: [id number] Adm. Date:  04/21/99 Disc. Date: 04/22/99 CC:         Dr. Rise Paganini, M.D., [facility]                           Discharge Summary  DATE OF BIRTH:  03-10-1961  SUMMARY OF HISTORY:  [name] is a 50 year old female with history of chest discomfort.  She was admitted April 02, 1999 to April 05, 1999 with chest  pain.  An EKG was abnormal with some septal Q waves.  She has a strong family history of ordinary coronary artery disease; however, 2-D echocardiogram was normal.  A stress Cardiolite was abnormal with a septal defect with redistribution. She followed up in the office on April 11, 1999 and had a stress echo on January ______, 2001; however, she was admitted to [facility] with reoccurring chest discomfort, somewhat localized, described as cramping, and overall feeling poor.  PAST MEDICAL HISTORY:  She has a history of tubal ligation, status post appendectomy, chronic anemia, and dizziness.  CURRENT MEDICATIONS:  She is on Zoloft and iron.  LABORATORY DATA:  Laboratory data from [facility] is not on the chart. According to the progress notes, CKs and troponins were negative for myocardial infarction.  HOSPITAL COURSE:  The patient was transferred from [facility] on April 20, 1998 and admitted to [id number].  Beta blocker was added.  Cardiac catheterization on April 21, 1999 by Dr. Antoine Poche did not reveal any coronary artery disease and er ejection fraction was 65%.  LV pressure was 103/11, aorta 105/58.  Post sheath removal on bedrest, she was nauseated and had some hypotension, which responded to Zofran and fluids.  By the morning of April 22, 1999, she was ambulating the hall without difficulty and catheterization site was intact.  Thus, she was discharged home with a diagnosis of noncardiac chest discomfort.  Catheterization did not how any  evidence of coronary artery disease and she has normal LV function.  DISCHARGE MEDICATIONS:  She was asked to continue her Zoloft and iron, as she was previously doing.  DISCHARGE INSTRUCTIONS:  Her activities were restricted for two days in regards to lifting, driving, sexual activity, and heavy exertion.  She was asked to maintain a low salt/fat/cholesterol diet.  If she had any problems with her catheterization site, she was asked to call us immediately.  FOLLOW-UP:  She will follow up with Dr. Rise Paganini. DD:  04/22/99 TD:  04/22/99 Job: [id number]

## 2010-08-29 NOTE — Consult Note (Signed)
[facility]  Patient:    [name], [name] Visit Number: [id number] MRN: [id number]          Service Type: [facility] Location: [facility] Attending Physician:  Angelene Giovanni Dictated by:   Sondra Come, D.O. Proc. Date: 07/20/01 Admit Date:  06/28/2001   CC:         [name], M.D.   Consultation Report  HISTORY OF PRESENT ILLNESS:  [name] returns to clinic today for reevaluation.  She was last seen on June 15, 2001.  Since last visit, she has weaned herself off of OxyContin and Xanax, and states that psychologically and mentally she feels better about being off of these medications.  In terms of pain, this remains essentially the same at 5/10 on a subjective scale today. She does note an increase of psychosocial stressors over the past several days regarding family members.  She believes this is contributing to her increase in pain.  She has finished physical therapy and is currently doing a home exercise program for stretching and some gentle strengthening exercises.  We discussed possibly getting involved in aquatic therapy as well.  Her function and quality of life indices have remained essentially the same.  Her sleep is fair to good on Elavil 25 mg at bedtime.  She also continues to take Neurontin 300 mg t.i.d. which she states she feels is helping.  She also continues taking Humibid DM with questionable relief.  I review health and history form, and 14-point review of systems.  The patients pain mainly is located in her hips, buttocks, and radiates occasionally into her thighs.  The patient admits to fluctuating weight loss and weight gain with persistent fatigue but this is improved after she has been off of OxyContin and Xanax.  PHYSICAL EXAMINATION:  GENERAL:  Healthy female in no acute distress.  VITAL SIGNS:  Blood pressure 115/80, pulse 84, respirations 12, O2 saturation is 99% on room air.  NEUROLOGIC:  Manual muscle testing is 5/5  bilateral upper and lower extremities.  Sensory exam is intact to light touch bilateral upper and lower extremities at this time.  Muscle stretch reflexes are 2+/4 bilateral upper and lower extremities.  MUSCULOSKELETAL:  Straight leg raise is negative bilaterally.  FABER test is negative bilaterally.  Palpatory examination reveals tenderness to palpation bilateral gluteal muscles.  IMPRESSION: 1. Fibromyalgia syndrome. 2. History of lupus. 3. Nonrestorative sleep disorder, improved.  PLAN: 1. I discussed further options with the patient in terms of treatment.    Continue non-narcotic based analgesics and adjunctive medication to include    Elavil and Neurontin.  Will add Ultracet as needed for severe pain.  I gave    the patient 30 sample pills to take one to two as needed three times daily.    Also will start Zanaflex 4 mg one-half to one p.o. b.i.d. as needed.  The    patient is instructed to stop taking her Humibid DM for a few days to    determine whether or not it is helpful.  I have asked her to do this prior    to starting the Ultracet and Zanaflex.  If it is helping, she should    continue.  If it is not helping, she should discontinue. 2. The patient is to look into joining [facility] where she can get involved in an    aquatics program. 3. The patient is to continue home exercise program. 4. The patient is to continue following with her therapist  in regards to the    significant psychosocial stressors which I believe are contributing to the    patients current symptoms. 5. The patient is to return to clinic in two months for reevaluation.  The patient was educated in the above findings and recommendations, and understands.  There were no barriers to communication. Dictated by:   Sondra Come, D.O. Attending Physician:  Angelene Giovanni DD:  07/20/01 TD:  07/20/01 Job: [id number]

## 2010-08-29 NOTE — H&P (Signed)
NAME:  [name], WINDOM                         ACCOUNT NO.:  [id number]   MEDICAL RECORD NO.:  [id number]                   PATIENT TYPE:  OUT   LOCATION:  [facility]                                  FACILITY:  [facility]   PHYSICIAN:  Dalia Heading, M.D.               DATE OF BIRTH:  17-May-1960   DATE OF ADMISSION:  DATE OF DISCHARGE:                                HISTORY & PHYSICAL   CHIEF COMPLAINT:  Biliary colic, cholelithiasis.   HISTORY OF PRESENT ILLNESS:  Patient is a 50 year old white female who was  referred for evaluation and treatment of biliary colic secondary to  cholelithiasis.  She has been having upper abdominal pain, nausea,  indigestion, bloating, and fatty food intolerance over the past several  months.  No fever, chills, or jaundice has been noted.  Her symptoms seem to  be worsening.  She underwent endoscopic ultrasound of the hepatobiliary tree  at [facility] and was found to have cholelithiasis.  The common  bile duct was within normal limits.   PAST MEDICAL HISTORY:  1. Depression.  2. Chronic pain.   PAST SURGICAL HISTORY:  1. Tonsillectomy.  2. Appendectomy.  3. Tubal ligation.  4. Cardiac catheterization.  5. Hysterectomy and bilateral salpingo-oophorectomy.   CURRENT MEDICATIONS:  1. Ultram.  2. Vivelle-Dot.  3. Klonopin.  4. Zoloft.  5. Flonase.   ALLERGIES:  PENICILLIN and DEMEROL.   REVIEW OF SYSTEMS:  Patient states that she suffers from both lupus and  fibromyalgia.  She denies drinking or smoking.   PHYSICAL EXAMINATION:  GENERAL:  Patient is a well-developed, well-nourished  white female in no acute distress.  VITAL SIGNS:  She is afebrile, and vital signs are stable.  HEENT EXAMINATION:  Reveals no scleral icterus.  LUNGS:  Clear to auscultation with equal breath sounds bilaterally.  HEART EXAMINATION:  Reveals a regular rate and rhythm without S3, S4, or  murmurs.  ABDOMEN:  Soft and nondistended.  She is tender in the  right upper quadrant  to palpation.  No hepatosplenomegaly, masses, or hernias are identified.   IMPRESSION:  Biliary colic, cholelithiasis.   PLAN:  The patient is scheduled for a laparoscopic cholecystectomy on  07/28/2002.  The risks and benefits of the procedure, including bleeding,  infection, hepatobiliary injury, and the possibility of an open procedure,  were fully explained to the patient, gaining informed consent.                                               Dalia Heading, M.D.    MAJ/MEDQ  D:  07/20/2002  T:  07/20/2002  Job:  [id number]   cc:   Tommye Standard, M.D.  [address] - Resident  [place], [place] [address]   Madelin Rear.  Sherwood Gambler, M.D.  [address]  [place]  [place] [address]  Fax: [phone]

## 2010-08-29 NOTE — Consult Note (Signed)
[facility]  Patient:    [name], [name] Visit Number: [id number] MRN: [id number]          Service Type: [facility] Location: [facility] Attending Physician:  Sondra Come Dictated by:   Sondra Come, D.O. Proc. Date: 10/17/01 Admit Date:  09/12/2001   CC:         Elfredia Nevins, M.D.   Consultation Report  Ms. Vargus returns to clinic today as scheduled for reevaluation.  She was last seen on September 19, 2001.  She continues to do very well in terms of pain from fibromyalgia syndrome.  She is very active and has increased function and quality of life indexes overall.  Her pain is a 3/10 on a subjective scale. She continues Neurontin 300 mg, but has decreased the dose to b.i.d. and has noticed any significant change.  She also continues Elavil 25 mg at bedtime and sleeps fairly well.  She is somewhat concerned over weight gain and we discussed Neurontin and Elavil in this regard.  We discussed weaning Neurontin completely and discontinuing Elavil.  She continues also on Humibid DM for its dextromethorphan properties in regards to the pain.  She also takes Ultracet one per day as needed.  She continues on glucosamine and chondroitin.  I have tried her on Zanaflex in the past.  She tolerated it well, but is not currently taking this.  We discussed substituting Zanaflex for Elavil for sleep.  I review health and history form and 14 point review of systems.  Pain is 3/10 on a subjective scale.  Sleep is fair to good.  PHYSICAL EXAMINATION  GENERAL:  Healthy female in no acute distress.  VITAL SIGNS:  Blood pressure 125/52, pulse 82, respirations 12, O2 saturation 100% on room air.  NEUROLOGIC:  Manual muscle testing is 5/5 bilateral upper and lower extremities.  Sensory examination is intact to light touch bilateral upper and lower extremities.  Muscle stretch reflexes are symmetric bilateral upper and lower extremities.  IMPRESSION: 1. Fibromyalgia  syndrome. 2. History of lupus. 3. Nonrestorative sleep disorder, improved.  PLAN: 1. Discontinue Elavil. 2. Resume Zanaflex 4 mg one-half to one p.o. q.h.s. for sleep and myofascial    component. 3. Wean Neurontin to 300 mg one p.o. q.d. over the next five days, then    discontinue.  If this seems to have been helping patient, will consider    changing her to Zonegran which has side effect of weight loss. 4. Continue Humibid DM one p.o. b.i.d. for now. 5. Continue Ultracet one to two p.o. q.i.d. as needed. 6. Would consider trazodone or Ambien for sleep if Zanaflex is not effective. 7. The patient is to return to clinic in three months for reevaluation.  The patient was educated on the above findings and recommendations and understands.  No barriers to communication. Dictated by:   Sondra Come, D.O. Attending Physician:  Sondra Come DD:  10/17/01 TD:  10/19/01 Job: [id number]

## 2010-08-29 NOTE — Consult Note (Signed)
[facility]  Patient:    [name], [name] Visit Number: [id number] MRN: [id number]          Service Type: [facility] Location: [facility] Attending Physician:  Sondra Come Dictated by:   Sondra Come, D.O. Proc. Date: 05/19/01 Admit Date:  05/17/2001   CC:         Elfredia Nevins, M.D.                          Consultation Report  Dear Dr. Sherwood Gambler:  Thank you very much for kindly referring [name] to [facility] for evaluation.  Patient was seen today in our clinic.  Please refer to the following for details regarding the history and physical examination and treatment plan.  Once again, thank you for allowing [place] to participate in the care of [name].  CHIEF COMPLAINT:  Pain all over.  HISTORY OF PRESENT ILLNESS:  [name] is a pleasant 50 year old right hand dominant female who was evaluated in a chaperoned environment.  Patient states she has a history of fibromyalgia and lupus and has pain all over her body, but mainly in her low back and lower extremities.  Her pain is a 4/10 on a subjective scale but sometimes she describes this as horrible.  Her symptoms are constant, achy, throbbing, sharp, burning, stabbing with associated numbness, tingling, and weakness.  Her function and quality of life indexes have declined.  Her sleep is fair to poor.  Her symptoms are improved with rest and medications to some degree.  Currently, she is taking OxyContin 20 mg b.i.d. over the past 16 months.  She expresses a desire to get off of her pain medications.  She has tried Designer, multimedia, Vicodin, Lortab, OxyContin, Vioxx, Motrin, and prednisone in the past.  She has not had any physical therapy. She has been evaluated and followed by Dr. Kellie Simmering and was started on alprazolam at bedtime.  She also expresses the desire to get off the Xanax. She has taken Zoloft 100 mg daily prescribed to her by mental health clinic which  followed her for depression.  She states that her symptoms started approximately two years ago.  At that time she notes a difficult divorce, her mother dying, and other psychosocial stressors.  She requests any type of intervention that will help her deal with her pain.  I review the health and history form and 14 point review of systems.  Patient admits to some unexplained weight loss and fatigue.  She feels that this is secondary to her depression.  She also complains of headaches and states she was diagnosed with migraine headaches.  PAST MEDICAL HISTORY:  Fibromyalgia, lupus.  PAST SURGICAL HISTORY:  Tonsillectomy, appendectomy, tubal ligation, hysterectomy with bilateral salpingo-oophorectomy, heart catheterization.  FAMILY HISTORY:  Heart disease.  SOCIAL HISTORY:  Denies smoking or alcohol use.  She is currently married, but does not work.  ALLERGIES:  PENICILLIN.  MEDICATIONS: 1. Vivelle DOT. 2. OxyContin 20 mg b.i.d. 3. Alprazolam 0.5 mg one and a half tablets at bedtime. 4. Zoloft 100 mg daily. 5. Metoclopramide 10 mg p.r.n.  PHYSICAL EXAMINATION  GENERAL:  Healthy female in no acute distress.  VITAL SIGNS:  Blood pressure 123/64, pulse 71, respirations 12, O2 saturation 97% on room air.  NEUROLOGIC:  Patient has flat affect and mildly depressed mood.  Manual muscle testing is 5/5 bilateral upper and lower extremities in all muscle groups tested.  Sensory examination is intact to light touch bilateral upper and lower extremities and all dermatomal distributions.  Muscle stretch reflexes are 2+/4 bilateral biceps, triceps, brachioradialis, pronator tares, patellar, medial hamstrings, and Achilles.  Mikey Bussing is absent bilaterally.  Toes are downgoing bilaterally.  Straight leg raise is negative bilaterally.  Pearlean Brownie is negative bilaterally.  SKIN:  There are no rashes or lesions noted about the face, upper or lower extremities.  BACK:  Range of motion of the lumbar  spine is full in all planes without significant discomfort.  Range of motion of the cervical spine is full in all planes without significant discomfort.  EXTREMITIES:  Range of motion of the upper extremities is full bilaterally. Palpatory examination reveals diffuse tenderness to palpation in the cervical paraspinals, parascapular musculature, upper and lower extremities, as well as gluteal muscles.  No heat, erythema, or edema in the upper and lower extremities.  Distal pulses present bilaterally.  LABORATORIES:  MRI of the lumbar spine dated October 08, 1999 is negative.  CT scan of the head was performed but I do not have these results at hand today.  IMPRESSION: 1. Fibromyalgia syndrome. 2. Psychosocial stressors contributing to #1. 3. History of lupus.  PLAN: 1. I had a long discussion with [name] regarding her pain complaints and    treatment options.  This was an extensive consultation, 45 minutes face to    face time.  We discussed medication usage and multidirectional treatment    approach to fibromyalgia. 2. Patient would sincerely like to wean herself off of the OxyContin and we    discussed this at length.  In addition, typically narcotic based analgesia    is not indicated in fibromyalgia syndrome.  I think it would be reasonable    for the patient to ultimately be weaned from the OxyContin.  However, I    would like her to continue using this at this point since I will refer her    to physical therapy and I would like to have adequate pain control during    her initial phase of the physical therapy which ultimately will be more    beneficial for her. 3. In addition to physical therapy I will refer patient to behavioral health    psychology for coping strategies, behavior modification, and stress and    anxiety management. 4. Discontinue alprazolam for sleep. 5. Will begin Elavil 25 mg one p.o. q.h.s. for restorative sleep capacity. 6. Patient to continue  Zoloft. 7. Will prescribe Neurontin 300 mg one p.o. q.h.s. x3 days, b.i.d. x3 days,     then t.i.d., also for sleep capacity as well as neuropathic component. 8. Patient to return to clinic in one month for reevaluation.  Patient was educated on the above findings and recommendations and understands.  There were no barriers to communication. Dictated by:   Sondra Come, D.O. Attending Physician:  Sondra Come DD:  05/19/01 TD:  05/20/01 Job: [id number]

## 2011-03-09 ENCOUNTER — Other Ambulatory Visit (HOSPITAL_COMMUNITY): Payer: Self-pay | Admitting: Internal Medicine

## 2011-03-09 DIAGNOSIS — Z139 Encounter for screening, unspecified: Secondary | ICD-10-CM

## 2011-03-12 ENCOUNTER — Ambulatory Visit (HOSPITAL_COMMUNITY): Payer: Self-pay

## 2012-06-22 ENCOUNTER — Other Ambulatory Visit (HOSPITAL_COMMUNITY): Payer: Self-pay | Admitting: Family Medicine

## 2012-06-22 ENCOUNTER — Ambulatory Visit (HOSPITAL_COMMUNITY)
Admission: RE | Admit: 2012-06-22 | Discharge: 2012-06-22 | Disposition: A | Discharge status: Home / Self Care | Payer: Self-pay | Source: Ambulatory Visit | Attending: Family Medicine | Admitting: Family Medicine

## 2012-06-22 DIAGNOSIS — Z9889 Other specified postprocedural states: Secondary | ICD-10-CM | POA: Insufficient documentation

## 2012-06-22 DIAGNOSIS — R509 Fever, unspecified: Secondary | ICD-10-CM | POA: Insufficient documentation

## 2012-06-22 DIAGNOSIS — R1032 Left lower quadrant pain: Secondary | ICD-10-CM | POA: Insufficient documentation

## 2012-06-22 DIAGNOSIS — K589 Irritable bowel syndrome without diarrhea: Secondary | ICD-10-CM | POA: Insufficient documentation

## 2012-07-06 ENCOUNTER — Encounter (INDEPENDENT_AMBULATORY_CARE_PROVIDER_SITE_OTHER): Payer: Self-pay | Admitting: Internal Medicine

## 2012-07-06 ENCOUNTER — Other Ambulatory Visit (INDEPENDENT_AMBULATORY_CARE_PROVIDER_SITE_OTHER): Payer: Self-pay | Admitting: *Deleted

## 2012-07-06 ENCOUNTER — Encounter (INDEPENDENT_AMBULATORY_CARE_PROVIDER_SITE_OTHER): Payer: Self-pay | Admitting: *Deleted

## 2012-07-06 ENCOUNTER — Ambulatory Visit (INDEPENDENT_AMBULATORY_CARE_PROVIDER_SITE_OTHER): Payer: Self-pay | Admitting: Internal Medicine

## 2012-07-06 VITALS — BP 96/50 | HR 76 | Temp 98.2°F | Ht 63.0 in | Wt 138.3 lb

## 2012-07-06 DIAGNOSIS — G894 Chronic pain syndrome: Secondary | ICD-10-CM

## 2012-07-06 DIAGNOSIS — Z820 Family history of epilepsy and other diseases of the nervous system: Secondary | ICD-10-CM

## 2012-07-06 DIAGNOSIS — R1032 Left lower quadrant pain: Secondary | ICD-10-CM

## 2012-07-06 DIAGNOSIS — Z1211 Encounter for screening for malignant neoplasm of colon: Secondary | ICD-10-CM

## 2012-07-06 DIAGNOSIS — F329 Major depressive disorder, single episode, unspecified: Secondary | ICD-10-CM

## 2012-07-06 DIAGNOSIS — R52 Pain, unspecified: Secondary | ICD-10-CM

## 2012-07-06 DIAGNOSIS — M329 Systemic lupus erythematosus, unspecified: Secondary | ICD-10-CM

## 2012-07-06 DIAGNOSIS — F32A Depression, unspecified: Secondary | ICD-10-CM | POA: Insufficient documentation

## 2012-07-06 NOTE — Progress Notes (Signed)
Subjective:     Patient ID: [name], female   DOB: 06/26/1960, 52 y.o.   MRN: [id number]  HPI Referred to our office by [facility] on 06/22/2012. Pain left lower quadrant. Empirically treated for diverticulitis  with Flagyl 500mg  BID x 7 days and Cipro 500mg  BID x 7 days. [place] on 06/22/2012 showed no acute findings. She tells me the LLQ was severe. She had diarrhea with her symptoms. She ran a fever of 101.0. She had pain x 3 days before going to her PCP. Today she feels better. She tells me she is 100% better.  BMs are normal. She keeps diarrhea. Usually has a BM 5 times a day which is normal for her.  She today says she has some epigastric pain which started after taking the antibiotics. She occasionally takes an anti-diarrhea.   01/15/2005 EGD: (Recurrent epigastric pain and nausea).  FINAL DIAGNOSIS: Nonerosive antral gastritis, otherwise normal EGD.  Wonder if her epigastric pain is also IBS.  02/10/2002: EGD: epigastric pain not responding to PPI: FINAL DIAGNOSES:  1. Small sliding hiatal hernia at gastroesophageal junction.  2. No evidence of peptic ulcer disease or gastritis.  06/23/2012 WBC 12.6, RBC 4.34, H and H 13.0 and 38.3, Platelets 282, MCV 88.2, Neutrophiils 76% NA 142, K 4.3, [place] 106, C02 26, BUN 9, Creat. 0.68, Glucose random 78  Review of Systems see hpi Current Outpatient Prescriptions  Medication Sig Dispense Refill  . clonazePAM (KLONOPIN) 1 MG tablet Take 1 mg by mouth at bedtime.      Marland Kitchen escitalopram (LEXAPRO) 20 MG tablet Take 20 mg by mouth daily.      Marland Kitchen estradiol (VIVELLE-DOT) 0.1 MG/24HR Place 1 patch onto the skin 2 (two) times a week.      . naproxen sodium (ANAPROX) 220 MG tablet Take 220 mg by mouth 2 (two) times daily with a meal.      . oxycodone-acetaminophen (LYNOX) 10-300 MG per tablet Take 1 tablet by mouth every 4 (four) hours as needed for pain.      . traMADol (ULTRAM) 50 MG tablet Take 50 mg by mouth every 6 (six) hours as needed for  pain.       No current facility-administered medications for this visit.   Current Outpatient Prescriptions  Medication Sig Dispense Refill  . clonazePAM (KLONOPIN) 1 MG tablet Take 1 mg by mouth at bedtime.      Marland Kitchen escitalopram (LEXAPRO) 20 MG tablet Take 20 mg by mouth daily.      Marland Kitchen estradiol (VIVELLE-DOT) 0.1 MG/24HR Place 1 patch onto the skin 2 (two) times a week.      . naproxen sodium (ANAPROX) 220 MG tablet Take 220 mg by mouth 2 (two) times daily with a meal.      . oxycodone-acetaminophen (LYNOX) 10-300 MG per tablet Take 1 tablet by mouth every 4 (four) hours as needed for pain.      . traMADol (ULTRAM) 50 MG tablet Take 50 mg by mouth every 6 (six) hours as needed for pain.       No current facility-administered medications for this visit.   Past Medical History  Diagnosis Date  . Fibromyalgia   . Lupus   . Depression   . Chronic pain   . Migraine headache    Allergies  Allergen Reactions  . Penicillins         Objective:   Physical Exam  Filed Vitals:   07/06/12 0934  BP: 96/50  Pulse: 76  Temp: 98.2 F (36.8 C)  Height: 5\' 3"  (1.6 m)  Weight: 138 lb 4.8 oz (62.732 kg)  Alert and oriented. Skin warm and dry. Oral mucosa is moist.   . Sclera anicteric, conjunctivae is pink. Thyroid not enlarged. No cervical lymphadenopathy. Lungs clear. Heart regular rate and rhythm.  Abdomen is soft. Bowel sounds are positive. No hepatomegaly. No abdominal masses felt. Slight abdominal tenderness.  No left lower quadrant pain No edema to lower extremities.        Assessment:    Diverticulitis resolved at this time. Patient is pain free.    Plan:    CT abdomen/pelvis with CM for left lower quadrant pain for resolution of diverticulitis.  Colonoscopy

## 2012-07-06 NOTE — Patient Instructions (Addendum)
CT abdomen/pelvis with CM. Colonoscopy

## 2012-07-11 ENCOUNTER — Other Ambulatory Visit (HOSPITAL_COMMUNITY): Payer: Self-pay

## 2012-07-12 ENCOUNTER — Ambulatory Visit (HOSPITAL_COMMUNITY)
Admission: RE | Admit: 2012-07-12 | Discharge: 2012-07-12 | Disposition: A | Discharge status: Home / Self Care | Payer: Self-pay | Source: Ambulatory Visit | Attending: Internal Medicine | Admitting: Internal Medicine

## 2012-07-12 ENCOUNTER — Telehealth (INDEPENDENT_AMBULATORY_CARE_PROVIDER_SITE_OTHER): Payer: Self-pay | Admitting: Internal Medicine

## 2012-07-12 DIAGNOSIS — R1032 Left lower quadrant pain: Secondary | ICD-10-CM | POA: Insufficient documentation

## 2012-07-12 DIAGNOSIS — K5730 Diverticulosis of large intestine without perforation or abscess without bleeding: Secondary | ICD-10-CM | POA: Insufficient documentation

## 2012-07-12 MED ORDER — IOHEXOL 300 MG/ML  SOLN
100.0000 mL | Freq: Once | INTRAMUSCULAR | Status: AC | PRN
  Administered 2012-07-12: 100 mL via INTRAVENOUS

## 2012-07-12 NOTE — Telephone Encounter (Signed)
Results given to patient. Pain free. Scheduled for a colonoscopy in about 3 weeks. If she began to have pain, I advised her to call our office and we would need to postpone the colonoscopy

## 2012-07-22 ENCOUNTER — Encounter (HOSPITAL_COMMUNITY): Payer: Self-pay | Admitting: Pharmacy Technician

## 2012-07-28 ENCOUNTER — Encounter (INDEPENDENT_AMBULATORY_CARE_PROVIDER_SITE_OTHER): Payer: Self-pay

## 2012-08-05 ENCOUNTER — Encounter (HOSPITAL_COMMUNITY): Admission: RE | Payer: Self-pay | Source: Ambulatory Visit

## 2012-08-05 ENCOUNTER — Ambulatory Visit (HOSPITAL_COMMUNITY): Admission: RE | Admit: 2012-08-05 | Payer: Self-pay | Source: Ambulatory Visit | Admitting: Internal Medicine

## 2012-08-05 SURGERY — COLONOSCOPY
Anesthesia: Moderate Sedation

## 2013-10-06 ENCOUNTER — Other Ambulatory Visit (HOSPITAL_COMMUNITY): Payer: Self-pay | Admitting: Internal Medicine

## 2013-10-06 DIAGNOSIS — R109 Unspecified abdominal pain: Secondary | ICD-10-CM

## 2013-10-10 ENCOUNTER — Ambulatory Visit (HOSPITAL_COMMUNITY)
Admission: RE | Admit: 2013-10-10 | Discharge: 2013-10-10 | Disposition: A | Discharge status: Home / Self Care | Payer: Self-pay | Source: Ambulatory Visit | Attending: Internal Medicine | Admitting: Internal Medicine

## 2013-10-10 DIAGNOSIS — R109 Unspecified abdominal pain: Secondary | ICD-10-CM | POA: Insufficient documentation

## 2013-10-10 DIAGNOSIS — Z9089 Acquired absence of other organs: Secondary | ICD-10-CM | POA: Insufficient documentation

## 2014-02-08 ENCOUNTER — Other Ambulatory Visit (HOSPITAL_COMMUNITY): Payer: Self-pay | Admitting: *Deleted

## 2014-02-08 DIAGNOSIS — N6310 Unspecified lump in the right breast, unspecified quadrant: Secondary | ICD-10-CM

## 2014-03-01 ENCOUNTER — Encounter (HOSPITAL_COMMUNITY): Payer: Self-pay

## 2014-03-01 ENCOUNTER — Ambulatory Visit
Admission: RE | Admit: 2014-03-01 | Discharge: 2014-03-01 | Disposition: A | Discharge status: Home / Self Care | Payer: No Typology Code available for payment source | Source: Ambulatory Visit | Attending: Obstetrics and Gynecology | Admitting: Obstetrics and Gynecology

## 2014-03-01 ENCOUNTER — Other Ambulatory Visit (HOSPITAL_COMMUNITY): Payer: Self-pay | Admitting: Obstetrics and Gynecology

## 2014-03-01 ENCOUNTER — Other Ambulatory Visit: Payer: Self-pay | Admitting: Obstetrics and Gynecology

## 2014-03-01 ENCOUNTER — Other Ambulatory Visit: Payer: Self-pay

## 2014-03-01 ENCOUNTER — Ambulatory Visit (HOSPITAL_COMMUNITY)
Admission: RE | Admit: 2014-03-01 | Discharge: 2014-03-01 | Disposition: A | Discharge status: Home / Self Care | Payer: Self-pay | Source: Ambulatory Visit | Attending: Obstetrics and Gynecology | Admitting: Obstetrics and Gynecology

## 2014-03-01 VITALS — BP 104/64 | Ht 63.0 in | Wt 144.2 lb

## 2014-03-01 DIAGNOSIS — N6310 Unspecified lump in the right breast, unspecified quadrant: Secondary | ICD-10-CM

## 2014-03-01 DIAGNOSIS — Z1239 Encounter for other screening for malignant neoplasm of breast: Secondary | ICD-10-CM

## 2014-03-01 DIAGNOSIS — N6311 Unspecified lump in the right breast, upper outer quadrant: Secondary | ICD-10-CM

## 2014-03-01 NOTE — Patient Instructions (Signed)
Explained to [employer] that she did not need a Pap smear today due to last Pap smear due to her history of a hysterectomy for benign reasons. Let patient know that she does not need any further Pap smears due to her history of a hysterectomy for benign reasons. Referred patient to the [facility] for diagnostic mammogram and possible right breast ultrasound. Appointment scheduled for Thursday, March 01, 2014 at 1015. Patient aware of appointment and will be there. [place] verbalized understanding.  [place], Arvil Chaco, RN 9:22 AM

## 2014-03-01 NOTE — Progress Notes (Signed)
Complaints of right breast lump over 6 months, new left nipple inversion within the last 9 months, and pain in both breasts. Patient stated pain in both breasts is achy and rates at a 2 out of 10.  Pap Smear:  Pap smear not completed today. Last Pap smear was in 2010 at [facility] and normal per patient. Per patient has a history of abnormal Pap smears that showed a chronic acute inflammation. Patient stated she never had a colposcopy, cryotherapy, or a LEEP. Patient has a history of a hysterectomy in 2002 for DUB and fibroids. Patient no longer needs Pap smears due to her history of a hysterectomy for benign reasons per BCCCP and ACOG guidelines. No Pap smear results in EPIC.  Physical exam: Breasts Breasts symmetrical. No skin abnormalities bilateral breasts. No nipple retraction right breast. Nipple retraction left nipple per patient has happened over the past 9 months. Patient stated this is new for her. No nipple discharge bilateral breasts. No lymphadenopathy. No lumps palpated left breast. Palpated a moveable lump within the right breast at 10 o'clock 2 cm from the nipple. No complaints of pain or tenderness on exam. Referred patient to the [facility] for diagnostic mammogram and possible right breast ultrasound. Appointment scheduled for Thursday, March 01, 2014 at 1015.   Pelvic/Bimanual No Pap smear completed today since patient has a history of a hysterectomy for benign reasons. Pap smear not indicated per BCCCP guidelines.

## 2014-03-01 NOTE — Addendum Note (Signed)
Encounter addended by: Loletta Parish, RN on: 03/01/2014  3:53 PM<BR>     Documentation filed: Visit Diagnoses

## 2014-03-02 ENCOUNTER — Other Ambulatory Visit (HOSPITAL_COMMUNITY): Payer: Self-pay | Admitting: *Deleted

## 2014-03-30 ENCOUNTER — Ambulatory Visit (HOSPITAL_COMMUNITY)
Admission: RE | Admit: 2014-03-30 | Discharge: 2014-03-30 | Disposition: A | Discharge status: Home / Self Care | Payer: Disability Insurance | Source: Ambulatory Visit | Attending: Family Medicine | Admitting: Family Medicine

## 2014-03-30 ENCOUNTER — Other Ambulatory Visit (HOSPITAL_COMMUNITY): Payer: Self-pay | Admitting: Family Medicine

## 2014-03-30 DIAGNOSIS — M545 Low back pain: Secondary | ICD-10-CM | POA: Diagnosis not present

## 2014-03-30 DIAGNOSIS — G8929 Other chronic pain: Secondary | ICD-10-CM | POA: Insufficient documentation

## 2014-03-30 DIAGNOSIS — M329 Systemic lupus erythematosus, unspecified: Secondary | ICD-10-CM | POA: Insufficient documentation

## 2014-03-30 DIAGNOSIS — M797 Fibromyalgia: Secondary | ICD-10-CM | POA: Insufficient documentation

## 2014-03-30 DIAGNOSIS — Z9049 Acquired absence of other specified parts of digestive tract: Secondary | ICD-10-CM | POA: Insufficient documentation

## 2014-04-12 ENCOUNTER — Other Ambulatory Visit: Payer: Self-pay

## 2014-04-12 ENCOUNTER — Ambulatory Visit (HOSPITAL_BASED_OUTPATIENT_CLINIC_OR_DEPARTMENT_OTHER): Payer: Self-pay

## 2014-04-12 VITALS — BP 108/68 | HR 72 | Temp 98.0°F | Resp 18 | Ht 61.75 in | Wt 144.9 lb

## 2014-04-12 DIAGNOSIS — Z0000 Encounter for general adult medical examination without abnormal findings: Secondary | ICD-10-CM

## 2014-04-12 LAB — GLUCOSE (CC13): Glucose: 93 mg/dl (ref 70–140)

## 2014-04-12 LAB — LIPID PANEL
Cholesterol: 230 mg/dL — ABNORMAL HIGH (ref 0–200)
HDL: 94 mg/dL (ref >39)
LDL Cholesterol: 111 mg/dL — ABNORMAL HIGH (ref 0–99)
TRIGLYCERIDES: 124 mg/dL (ref <150)
Total CHOL/HDL Ratio: 2.4 Ratio
VLDL: 25 mg/dL (ref 0–40)

## 2014-04-12 LAB — HEMOGLOBIN A1C
Hgb A1c MFr Bld: 5.7 % — ABNORMAL HIGH (ref <5.7)
Mean Plasma Glucose: 117 mg/dL — ABNORMAL HIGH (ref <117)

## 2014-04-12 NOTE — Patient Instructions (Addendum)
Discussed health assessment with patient. Reviewed options. Wait til call results on Monday for lifestyle decision. Verbalized understanding.

## 2014-04-12 NOTE — Progress Notes (Signed)
Patient is a new patient to the [facility] program and is currently a BCCCP patient effective 03/31/2014.   Clinical Measurements: Patient is 5 ft. 13/4 inches, weight 144.9 lbs, waist circumference 34 inches, and hip circumference 41 inches.   Medical History: Patient has no history of high cholesterol. Patient does not have a history of hypertension or diabetes. Per patient no diagnosed history of coronary heart disease, heart attack, heart failure, stroke/TIA, vascular disease or congenital heart defects but has had 2 negative angiograms as result of Chest Pain.  Blood Pressure, Self-measurement: Patient states has no reason to check Blood pressure.  Nutrition Assessment: Patient stated that eats 1/2 cup fruits every day. Patient states she eats one serving of vegetables a day.Per patient does eat 3 or more ounces of whole grains daily. Patient doesn't eat two or more servings of fish weekly. Patient states she does not drink more than 36 ounces or 450 calories of beverages with added sugars weekly. Patient states she drinks a lot of water. Patient stated she does not watch her salt intake. She states she does not use much salt in cooking.  Physical Activity Assessment: Patient stated she does walk and household and yard chores every day for about 180 minutes per day.   Smoking Status: Patient had never smoked and is not around second hand smoke.  Quality of Life Assessment: In assessing patient's quality of life she stated that out of the past 30 days that she has felt her health was bad for 14 days.Patient also stated that in the past 30 days that her mental health was not good including stress, depression and problems with emotions for all 30 days. Patient did state that out of the past 30 days she felt her physical or mental health had not kept her from doing her usual activities including self-care, work or recreation.   Plan: Lab work will be done today including a lipid panel, blood  glucose, and Hgb A1C. Will call lab results when they are finished. Will discuss Lifestyle program/Health Coaching when call results.

## 2014-04-16 ENCOUNTER — Telehealth: Payer: Self-pay

## 2014-04-16 NOTE — Telephone Encounter (Signed)
Will try again or asked patient to return call.

## 2014-04-17 ENCOUNTER — Telehealth: Payer: Self-pay

## 2014-04-17 NOTE — Telephone Encounter (Signed)
Called to inform about lab work from 04/12/14. Iinformed patient: cholesterol- 230, HDL- 94, LDL- 111, triglycerides - 124, Bld Glucose -93 and HBG-A1C - 5.7.  Set up health Coaching for January 8 at 8:45 AM at cancer center for nutrition and activity.

## 2014-04-20 ENCOUNTER — Ambulatory Visit: Payer: Self-pay

## 2014-04-20 DIAGNOSIS — Z789 Other specified health status: Secondary | ICD-10-CM

## 2014-04-20 NOTE — Progress Notes (Signed)
Patient returns today for Health Coaching regarding Nutrition for her borderline AIC, cholesterol, and activity.   NUTRITION: Patient reviewed A1C handout to see about prediabetes, normal and diabetes range. Patient went over what prediabetes is, complications that can result if do not get levels to normal, and diabetes level. Discussed increasing fiber in diet, reading labels and serving sizes. Discussed watching carbohydrates, how to count them, serving sizes and number of carbs per day allowance. Patient reviewed weight /activity chart. Discussed changing over to artificial sweetened drinks. Discussed the importance of serving sizes, counting carbohydrates and increasing fiber in diet. Patient went over 1,600 cal diet and we broke it down to the number of servings she could have. Reviewed and did an assessment in [place]. Patient received and reviewed the following handouts: My Plate, Carb counting Menu, A1C Exam, Heart Healthy recipes, 8 Ways to Improve Cholesterol. New Leaf Book (cook book &. New Leaf Activity). Gave patient measuring cup to measure serving sizes, snack container, water bottle, and demonstrated the serving sizes.  ACTIVITY:Stressed the importance of exercise with Fibromyalgia. Discussed activity and walking. Reviewed [facility] Leaf Activity booklet. Patient received stretch band and pedometer.  PLAN: Patient will increase amount of walking per day and add other exercises to plan, Decrease carbohydrates in diet. Lose weight. Follow up times three per phone unless needs and can call.

## 2014-04-20 NOTE — Patient Instructions (Signed)
Patient will follow 1600 calorie diet plan. Will review all handouts and New Leaf activity book. Will work through assessments in [employer]. Will increase exercise and use pedometer slowly. Will measure portion sizes. Will call if has any questions. Will call patient in three weeks. Patient verbalized understanding.

## 2014-08-14 ENCOUNTER — Other Ambulatory Visit: Payer: Self-pay | Admitting: Obstetrics and Gynecology

## 2014-08-14 DIAGNOSIS — N6459 Other signs and symptoms in breast: Secondary | ICD-10-CM

## 2014-08-30 ENCOUNTER — Telehealth: Payer: Self-pay

## 2014-08-30 NOTE — Telephone Encounter (Signed)
Patient was called  for [facility] Program regarding Health Coaching. Patient stated that was good. Per patient is still watching her carbohydrates and sweets. Stated that had lost some weight. Patient stated that has not rechecked Hbg AC. Patient stated that still had no insurance and is seeing doctor in [place]. Patient discussed and asked a lot of questions concerning substance abuse and side effects of Neurontin. Patient discussed worry about her daughter possibly using drugs again. Discussed patient going to [place] and calling daughters doctorsPatient stated that was doing good on New Leaf(15 min.).

## 2014-08-31 ENCOUNTER — Ambulatory Visit
Admission: RE | Admit: 2014-08-31 | Discharge: 2014-08-31 | Disposition: A | Discharge status: Home / Self Care | Payer: No Typology Code available for payment source | Source: Ambulatory Visit | Attending: Obstetrics and Gynecology | Admitting: Obstetrics and Gynecology

## 2014-08-31 DIAGNOSIS — N6459 Other signs and symptoms in breast: Secondary | ICD-10-CM

## 2014-11-13 ENCOUNTER — Telehealth: Payer: Self-pay

## 2014-11-13 NOTE — Telephone Encounter (Signed)
Called to follow up on Health Coaching for cholesterol and A1C. Patient stated that had not been doing real good with carbohydrates and sugars. Per patient has had a flair up of lupus so has not been able to do a lot, Patient stated that was not trying to buy a lot of foods that are junk food. Patients goal is still to decrease A1C. Will call for follow up assessment and re-screen next year.(41min)

## 2015-01-03 ENCOUNTER — Telehealth: Payer: Self-pay

## 2015-01-03 NOTE — Telephone Encounter (Signed)
This is a follow up Assessment following Health Coaching with [facility] Program. .  Medication Status : Patient states is not taking medication for high cholesterol, hypertension, or diabetes.   Blood Pressure, Self-measurement: Patient states has no reason to check Blood pressure.  Nutrition Assessment: Patient stated that eats 2 fruits every day. Patient states she eats 2 servings of vegetables a day. Per Patient eats 3 or more ounces of whole grains daily. Patient stated doesn't eat two or more servings of fish weekly. Per patient hates fish. Patient states she does not drink more than 36 ounces or 450 calories of beverages with added sugars weekly. Patient stated she does watch her salt intake.   Physical Activity Assessment: Patient stated she walks and does household chores for around 270 minutes of moderate activity per week and no vigorous activity.  Smoking Status: Patient has never smoked and is not exposed to smoke.  Quality of Life Assessment: In assessing patient's quality of life she stated that out of the past 30 days that she has felt her health was good all but 10 days. Patient also stated that in the past 30 days that her mental health was not good including stress, depression and problems with emotions for all days. Patient did state that out of the past 30 days she felt her physical or mental health had kept her from doing her usual activities including self-care, work or recreation for 10 days.
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
## 2015-06-03 ENCOUNTER — Other Ambulatory Visit: Payer: Self-pay | Admitting: Obstetrics and Gynecology

## 2015-06-03 DIAGNOSIS — Z1231 Encounter for screening mammogram for malignant neoplasm of breast: Secondary | ICD-10-CM

## 2015-06-07 ENCOUNTER — Encounter (HOSPITAL_COMMUNITY): Payer: Self-pay

## 2015-06-07 ENCOUNTER — Ambulatory Visit
Admission: RE | Admit: 2015-06-07 | Discharge: 2015-06-07 | Disposition: A | Discharge status: Home / Self Care | Payer: No Typology Code available for payment source | Source: Ambulatory Visit | Attending: Obstetrics and Gynecology | Admitting: Obstetrics and Gynecology

## 2015-06-07 ENCOUNTER — Ambulatory Visit (HOSPITAL_COMMUNITY)
Admission: RE | Admit: 2015-06-07 | Discharge: 2015-06-07 | Disposition: A | Discharge status: Home / Self Care | Payer: Self-pay | Source: Ambulatory Visit | Attending: Obstetrics and Gynecology | Admitting: Obstetrics and Gynecology

## 2015-06-07 VITALS — BP 112/74 | Temp 98.1°F | Ht 63.0 in | Wt 143.0 lb

## 2015-06-07 DIAGNOSIS — Z1231 Encounter for screening mammogram for malignant neoplasm of breast: Secondary | ICD-10-CM

## 2015-06-07 DIAGNOSIS — Z1239 Encounter for other screening for malignant neoplasm of breast: Secondary | ICD-10-CM

## 2015-06-07 NOTE — Patient Instructions (Signed)
Educational materials on self breast awareness given. Explained to [employer] that she did not need a Pap smear today due to last Pap smear due to her history of a hysterectomy for benign reasons. Let patient know that she does not need any further Pap smears due to her history of a hysterectomy for benign reasons. Referred patient to the [facility] for a screening mammogram. Appointment scheduled for Thursday, June 07, 2015 at 1240. Patient aware of appointment and will be there. Boyd verbalized understanding.  [place], Arvil Chaco, RN 10:35 AM

## 2015-06-07 NOTE — Progress Notes (Signed)
Patient complained of bilateral breasts being tender and heavy feeling. Patient stated had the same complaints prior to her last mammogram that was 08/31/2014 and benign.  Pap Smear: Pap smear not completed today. Last Pap smear was in 2010 at [facility] and normal per patient. Per patient has a history of abnormal Pap smears that showed a chronic acute inflammation. Patient stated she never had a colposcopy, cryotherapy, or a LEEP. Patient has a history of a hysterectomy in 2002 for DUB and fibroids. Patient no longer needs Pap smears due to her history of a hysterectomy for benign reasons per BCCCP and ACOG guidelines. No Pap smear results in EPIC.  Physical exam: Breasts Breasts symmetrical. No skin abnormalities bilateral breasts. No nipple retraction right breast. Left nipple inverted and per patient has been like that for 3 years. No nipple discharge bilateral breasts. No lymphadenopathy. No lumps palpated bilateral breasts. No complaints of pain or tenderness on exam. Referred patient to the [facility] for a screening mammogram. Appointment scheduled for Thursday, June 07, 2015 at 1240.       Pelvic/Bimanual No Pap smear completed today since patient has a history of a hysterectomy for benign reasons. Pap smear not indicated per BCCCP guidelines.   Smoking History: Patient has never smoked.  Patient Navigation: Patient education provided. Access to services provided for patient through [facility] program.   Colorectal Cancer Screening: Patient has never had a colonoscopy. Will talk to her PCP about a referral to have one completed. No complaints today.

## 2015-06-10 ENCOUNTER — Other Ambulatory Visit: Payer: Self-pay | Admitting: Obstetrics and Gynecology

## 2015-06-10 ENCOUNTER — Telehealth: Payer: Self-pay

## 2015-06-10 DIAGNOSIS — R928 Other abnormal and inconclusive findings on diagnostic imaging of breast: Secondary | ICD-10-CM

## 2015-06-10 NOTE — Telephone Encounter (Signed)
Called and informed about [facility]. Patient stated that she was interested in program. Patient was scheduled for March 24 at 9:30 AM. Explained that needed to be fasting and what to expect when gets to [facility] at [facility].

## 2015-06-12 ENCOUNTER — Other Ambulatory Visit (HOSPITAL_COMMUNITY): Payer: Self-pay | Admitting: *Deleted

## 2015-06-12 DIAGNOSIS — R928 Other abnormal and inconclusive findings on diagnostic imaging of breast: Secondary | ICD-10-CM

## 2015-06-17 ENCOUNTER — Encounter (HOSPITAL_COMMUNITY): Payer: Self-pay | Admitting: *Deleted

## 2015-06-17 ENCOUNTER — Other Ambulatory Visit: Payer: Self-pay

## 2015-06-17 ENCOUNTER — Other Ambulatory Visit: Payer: Self-pay | Admitting: Obstetrics and Gynecology

## 2015-06-17 DIAGNOSIS — R928 Other abnormal and inconclusive findings on diagnostic imaging of breast: Secondary | ICD-10-CM

## 2015-06-18 ENCOUNTER — Ambulatory Visit
Admission: RE | Admit: 2015-06-18 | Discharge: 2015-06-18 | Disposition: A | Discharge status: Home / Self Care | Payer: No Typology Code available for payment source | Source: Ambulatory Visit | Attending: Obstetrics and Gynecology | Admitting: Obstetrics and Gynecology

## 2015-06-18 DIAGNOSIS — R928 Other abnormal and inconclusive findings on diagnostic imaging of breast: Secondary | ICD-10-CM

## 2015-07-05 ENCOUNTER — Other Ambulatory Visit: Payer: Self-pay

## 2015-07-05 ENCOUNTER — Ambulatory Visit: Payer: No Typology Code available for payment source

## 2015-08-01 ENCOUNTER — Ambulatory Visit: Payer: Self-pay

## 2015-08-01 ENCOUNTER — Other Ambulatory Visit: Payer: Self-pay

## 2015-08-01 VITALS — BP 104/72 | HR 60 | Temp 97.8°F | Resp 14 | Ht 62.0 in | Wt 143.3 lb

## 2015-08-01 DIAGNOSIS — Z0000 Encounter for general adult medical examination without abnormal findings: Secondary | ICD-10-CM

## 2015-08-01 NOTE — Patient Instructions (Signed)
Discussed health assessment with patient.She will be called with results of lab work and we will then discussed any further follow up the patient needs. Patient verbalized understanding. 

## 2015-08-01 NOTE — Progress Notes (Signed)
Patient is returning as a re screen to the [employer] and is currently a BCCCP patient effective 06/07/2015.  Clinical Measurements: Patient is 5 ft. 2 inches, weight 143.3 lbs, BMI 26.3 .   Medical History: Patient has no history of high cholesterol. Patient does not have a history of hypertension or diabetes. Per patient no diagnosed history of coronary heart disease, heart attack, heart failure, stroke/TIA, vascular disease or congenital heart defects. Patient states has had chest pain and two Cardiac Catheterizations which were negative.Patient has history of GERD's and Fibromyalgia/Lupus.  Blood Pressure, Self-measurement: Patient states has no reason to check Blood pressure.  Nutrition Assessment: Patient stated that eats 2 to 3 fruits every day. Patient states she eats 1 servings of vegetables a day. Per patient states does not eat 3 or more ounces or more of whole grains daily. Patient stated does not eat two or more servings of fish weekly. Patient states she does not drink more than 36 ounces or 450 calories of beverages with added sugars weekly. Patient states she drink drinks soda. Patient stated she does not watch her salt intake but does not use much salt.Marland Kitchen  Physical Activity Assessment: Patient stated that does around 300 minutes of moderate exercise per week. Patient rarely does any vigorous exercise.  Smoking Status: Patient has never smoked and is not exposed to smoke.  Quality of Life Assessment: In assessing patient's quality of life she stated that out of the past 30 days that she has felt her health was not good for 14 days. Patient also stated that in the past 30 days that her mental health was not good including stress, depression and problems with emotions for all days. Patient did state that out of the past 30 days she felt her physical or mental health had kept her from doing her usual activities including self-care, work or recreation for 15 days.   Plan: Lab work  was done today including a lipid panel and Hgb A1C. Will call lab results when they are finished. Will discuss risk reduction health coaching when call results.

## 2015-08-02 ENCOUNTER — Telehealth: Payer: Self-pay

## 2015-08-02 LAB — LIPID PANEL
CHOLESTEROL TOTAL: 222 mg/dL — AB (ref 100–199)
Chol/HDL Ratio: 2.1 ratio units (ref 0.0–4.4)
HDL: 107 mg/dL (ref >39)
LDL Calculated: 95 mg/dL (ref 0–99)
TRIGLYCERIDES: 100 mg/dL (ref 0–149)
VLDL Cholesterol Cal: 20 mg/dL (ref 5–40)

## 2015-08-02 LAB — HEMOGLOBIN A1C
ESTIMATED AVERAGE GLUCOSE: 114 mg/dL
Hemoglobin A1c: 5.6 % (ref 4.8–5.6)

## 2015-08-02 NOTE — Telephone Encounter (Signed)
LAB RESULTS: I called to inform patient about lab work from 08/01/15. I informed patient: cholesterol- 222, HDL- 107, LDL- 95, triglycerides - 100,  HBG-A1C - 5.6, and BMI 26.3.  [facility] HE[facility] COACHING: Did risk reduction counseling/[place] Coach concerning BMI/Weight and Total Cholesterol. Discussed how A1C and LDL's had come down into normal range since last year and cholesterol had come down but was still not within normal range. Discussed eating lean cuts of meat and good oils, losing weight and exercising. Per patient someone she knows is moving near her and she will now have some one to walk with. Patient stated that maybe she needed to eat more food with fiber. We discussed foods that were high in fiber. Patient stated that was trying her best to be healthier.  PLAN: Will call patient back for further health coaching.  I will send by mail patient papers that left and information on balance exercises. Patient can call if has any questions.

## 2015-08-27 ENCOUNTER — Telehealth: Payer: Self-pay

## 2015-08-27 NOTE — Telephone Encounter (Signed)
Called and patient stated had a cold and then phone went dead. Tried to call back and left message.

## 2015-10-07 ENCOUNTER — Telehealth: Payer: Self-pay

## 2015-10-07 NOTE — Telephone Encounter (Signed)
[place] Patient called per phone today for Health Coaching. Patient's areas of concern are Total cholesterol and exercise.  HEALTH COACHING: Patient stated had decreased the amount of bread, rice, and grease she is eating. Per patient has increased the amount of vegetables and fruit that she is eating. Patient stated has not been able to walk or exercise on most days due to the heat and humitity. Patient states is going to have to find an alternative for exercising before the weather cools down.  PLAN: Will Call in 3 to 4 weeks. Will continue with the good meal and nutrition changes. Will look for alternative plan for exercise.   TIME: 15 minutes

## 2015-11-11 ENCOUNTER — Telehealth: Payer: Self-pay

## 2015-11-11 NOTE — Telephone Encounter (Signed)
Called patient and was at a doctor's visit with grandson. Will call back at later time.

## 2015-11-15 ENCOUNTER — Telehealth: Payer: Self-pay

## 2015-11-15 NOTE — Telephone Encounter (Signed)
Called for third Health Coaching session and reached voicemail. Left message to return call.

## 2015-11-18 ENCOUNTER — Telehealth: Payer: Self-pay

## 2015-11-18 NOTE — Telephone Encounter (Signed)
Called for third Health Coaching session and reached voicemail. Left message to return call.

## 2016-07-13 ENCOUNTER — Other Ambulatory Visit (HOSPITAL_COMMUNITY): Payer: Self-pay | Admitting: *Deleted

## 2016-07-13 DIAGNOSIS — N6310 Unspecified lump in the right breast, unspecified quadrant: Secondary | ICD-10-CM

## 2016-07-28 ENCOUNTER — Ambulatory Visit
Admission: RE | Admit: 2016-07-28 | Discharge: 2016-07-28 | Disposition: A | Discharge status: Home / Self Care | Payer: No Typology Code available for payment source | Source: Ambulatory Visit | Attending: Obstetrics and Gynecology | Admitting: Obstetrics and Gynecology

## 2016-07-28 ENCOUNTER — Encounter (HOSPITAL_COMMUNITY): Payer: Self-pay

## 2016-07-28 ENCOUNTER — Other Ambulatory Visit (HOSPITAL_COMMUNITY): Payer: Self-pay | Admitting: *Deleted

## 2016-07-28 ENCOUNTER — Ambulatory Visit (HOSPITAL_COMMUNITY)
Admission: RE | Admit: 2016-07-28 | Discharge: 2016-07-28 | Disposition: A | Discharge status: Home / Self Care | Payer: Self-pay | Source: Ambulatory Visit | Attending: Obstetrics and Gynecology | Admitting: Obstetrics and Gynecology

## 2016-07-28 VITALS — BP 124/80 | Temp 97.6°F | Ht 63.0 in | Wt 145.2 lb

## 2016-07-28 DIAGNOSIS — Z1239 Encounter for other screening for malignant neoplasm of breast: Secondary | ICD-10-CM

## 2016-07-28 DIAGNOSIS — N6310 Unspecified lump in the right breast, unspecified quadrant: Secondary | ICD-10-CM

## 2016-07-28 DIAGNOSIS — N6311 Unspecified lump in the right breast, upper outer quadrant: Secondary | ICD-10-CM

## 2016-07-28 DIAGNOSIS — N6320 Unspecified lump in the left breast, unspecified quadrant: Secondary | ICD-10-CM

## 2016-07-28 NOTE — Progress Notes (Signed)
Complaints of right breast lump x 2 months and tenderness in bilateral breast.   Pap Smear: Pap smear not completed today. Last Pap smear was in 2010 at [facility] and normal per patient. Per patient has a history of abnormal Pap smears that showed a chronic acute inflammation. Patient stated she never had a colposcopy, cryotherapy, or a LEEP. Patient has a history of a hysterectomy in 2002 for DUB and fibroids. Patient no longer needs Pap smears due to her history of a hysterectomy for benign reasons per BCCCP and ACOG guidelines. No Pap smear results in EPIC.  Physical exam: Breasts Breasts symmetrical. No skin abnormalities bilateral breasts. No nipple retraction right breast. Left nipple inverted and per patient has been like that for 3 years. No nipple discharge bilateral breasts. No lymphadenopathy. No lumps palpated left breast. Palpated a lump within the right breast at 10 o'clock 3.5 cm from the nipple. No complaints of pain or tenderness on exam. Referred patient to the [facility] for a diagnostic mammogram and possible right breast ultrasound. Appointment scheduled for Tuesday, July 28, 2016 at 1050.        Pelvic/Bimanual No Pap smear completed today since patient has a history of a hysterectomy for benign reasons. Pap smear not indicated per BCCCP guidelines.   Smoking History: Patient has never smoked.  Patient Navigation: Patient education provided. Access to services provided for patient through [place] program.   Colorectal Cancer Screening: Per patient has never had a colonoscopy completed. No complaints today.

## 2016-07-28 NOTE — Patient Instructions (Signed)
Explained breast self awareness with [place]. Let her know that she did not need a Pap smear today due to her history of a hysterectomy for benign reasons. Informed patient know that she does not need any further Pap smears due to her history of a hysterectomy for benign reasons. Referred patient to the [facility] for a diagnostic mammogram and possible right breast ultrasound. Appointment scheduled for Tuesday, July 28, 2016 at 1050. [place] verbalized understanding.  Elesa Garman, Arvil Chaco, RN 12:54 PM

## 2016-07-31 ENCOUNTER — Encounter (HOSPITAL_COMMUNITY): Payer: Self-pay | Admitting: *Deleted

## 2016-08-01 ENCOUNTER — Other Ambulatory Visit: Payer: Self-pay

## 2016-08-06 LAB — FECAL OCCULT BLOOD, IMMUNOCHEMICAL: Fecal Occult Bld: NEGATIVE

## 2016-08-10 ENCOUNTER — Encounter (HOSPITAL_COMMUNITY): Payer: Self-pay | Admitting: *Deleted

## 2016-08-10 NOTE — Progress Notes (Signed)
Letter mailed to patient with negative Fit Test results.  

## 2017-01-25 ENCOUNTER — Encounter (HOSPITAL_COMMUNITY): Payer: Self-pay

## 2017-05-13 DIAGNOSIS — M797 Fibromyalgia: Secondary | ICD-10-CM | POA: Diagnosis not present

## 2017-05-13 DIAGNOSIS — M329 Systemic lupus erythematosus, unspecified: Secondary | ICD-10-CM | POA: Diagnosis not present

## 2017-05-13 DIAGNOSIS — E663 Overweight: Secondary | ICD-10-CM | POA: Diagnosis not present

## 2017-05-13 DIAGNOSIS — G894 Chronic pain syndrome: Secondary | ICD-10-CM | POA: Diagnosis not present

## 2017-05-27 DIAGNOSIS — R6889 Other general symptoms and signs: Secondary | ICD-10-CM | POA: Diagnosis not present

## 2017-05-27 DIAGNOSIS — J22 Unspecified acute lower respiratory infection: Secondary | ICD-10-CM | POA: Diagnosis not present

## 2017-05-27 DIAGNOSIS — Z1389 Encounter for screening for other disorder: Secondary | ICD-10-CM | POA: Diagnosis not present

## 2017-05-27 DIAGNOSIS — B349 Viral infection, unspecified: Secondary | ICD-10-CM | POA: Diagnosis not present

## 2017-05-27 DIAGNOSIS — Z6824 Body mass index (BMI) 24.0-24.9, adult: Secondary | ICD-10-CM | POA: Diagnosis not present

## 2017-06-15 DIAGNOSIS — G894 Chronic pain syndrome: Secondary | ICD-10-CM | POA: Diagnosis not present

## 2017-06-15 DIAGNOSIS — Z0001 Encounter for general adult medical examination with abnormal findings: Secondary | ICD-10-CM | POA: Diagnosis not present

## 2017-06-15 DIAGNOSIS — E748 Other specified disorders of carbohydrate metabolism: Secondary | ICD-10-CM | POA: Diagnosis not present

## 2017-06-15 DIAGNOSIS — M329 Systemic lupus erythematosus, unspecified: Secondary | ICD-10-CM | POA: Diagnosis not present

## 2017-06-22 DIAGNOSIS — J019 Acute sinusitis, unspecified: Secondary | ICD-10-CM | POA: Diagnosis not present

## 2017-06-22 DIAGNOSIS — Z6824 Body mass index (BMI) 24.0-24.9, adult: Secondary | ICD-10-CM | POA: Diagnosis not present

## 2017-06-22 DIAGNOSIS — L309 Dermatitis, unspecified: Secondary | ICD-10-CM | POA: Diagnosis not present

## 2017-06-23 DIAGNOSIS — L308 Other specified dermatitis: Secondary | ICD-10-CM | POA: Diagnosis not present

## 2017-06-23 DIAGNOSIS — L570 Actinic keratosis: Secondary | ICD-10-CM | POA: Diagnosis not present

## 2017-06-23 DIAGNOSIS — X32XXXD Exposure to sunlight, subsequent encounter: Secondary | ICD-10-CM | POA: Diagnosis not present

## 2017-06-24 ENCOUNTER — Encounter (INDEPENDENT_AMBULATORY_CARE_PROVIDER_SITE_OTHER): Payer: Self-pay | Admitting: *Deleted

## 2017-08-06 ENCOUNTER — Other Ambulatory Visit: Payer: Self-pay | Admitting: Internal Medicine

## 2017-08-06 DIAGNOSIS — Z1231 Encounter for screening mammogram for malignant neoplasm of breast: Secondary | ICD-10-CM

## 2017-08-26 ENCOUNTER — Ambulatory Visit
Admission: RE | Admit: 2017-08-26 | Discharge: 2017-08-26 | Disposition: A | Discharge status: Home / Self Care | Payer: 59 | Source: Ambulatory Visit | Attending: Internal Medicine | Admitting: Internal Medicine

## 2017-08-26 DIAGNOSIS — Z1231 Encounter for screening mammogram for malignant neoplasm of breast: Secondary | ICD-10-CM | POA: Diagnosis not present

## 2017-08-30 DIAGNOSIS — G894 Chronic pain syndrome: Secondary | ICD-10-CM | POA: Diagnosis not present

## 2017-08-30 DIAGNOSIS — R635 Abnormal weight gain: Secondary | ICD-10-CM | POA: Diagnosis not present

## 2017-08-30 DIAGNOSIS — M2550 Pain in unspecified joint: Secondary | ICD-10-CM | POA: Diagnosis not present

## 2017-09-01 DIAGNOSIS — Z1389 Encounter for screening for other disorder: Secondary | ICD-10-CM | POA: Diagnosis not present

## 2017-09-07 DIAGNOSIS — R400 Somnolence: Secondary | ICD-10-CM | POA: Diagnosis not present

## 2017-09-08 DIAGNOSIS — G4730 Sleep apnea, unspecified: Secondary | ICD-10-CM | POA: Diagnosis not present

## 2017-09-09 DIAGNOSIS — L989 Disorder of the skin and subcutaneous tissue, unspecified: Secondary | ICD-10-CM | POA: Diagnosis not present

## 2017-09-15 ENCOUNTER — Other Ambulatory Visit (INDEPENDENT_AMBULATORY_CARE_PROVIDER_SITE_OTHER): Payer: Self-pay | Admitting: *Deleted

## 2017-09-15 DIAGNOSIS — Z1211 Encounter for screening for malignant neoplasm of colon: Secondary | ICD-10-CM

## 2017-09-16 ENCOUNTER — Encounter (INDEPENDENT_AMBULATORY_CARE_PROVIDER_SITE_OTHER): Payer: Self-pay | Admitting: *Deleted

## 2017-09-16 ENCOUNTER — Telehealth (INDEPENDENT_AMBULATORY_CARE_PROVIDER_SITE_OTHER): Payer: Self-pay | Admitting: *Deleted

## 2017-09-16 DIAGNOSIS — Z1211 Encounter for screening for malignant neoplasm of colon: Secondary | ICD-10-CM | POA: Insufficient documentation

## 2017-09-16 NOTE — Telephone Encounter (Signed)
Patient needs trilyte 

## 2017-09-20 MED ORDER — PEG 3350-KCL-NA BICARB-NACL 420 G PO SOLR: 4000.0000 mL | Freq: Once | ORAL | 0 refills | Status: AC

## 2017-10-07 ENCOUNTER — Telehealth (INDEPENDENT_AMBULATORY_CARE_PROVIDER_SITE_OTHER): Payer: Self-pay | Admitting: *Deleted

## 2017-10-07 NOTE — Telephone Encounter (Signed)
agree

## 2017-10-07 NOTE — Telephone Encounter (Signed)
Referring MD/PCP: fusco   Procedure: tcs with propofol  Reason/Indication:  screening  Has patient had this procedure before?  no  If so, when, by whom and where?    Is there a family history of colon cancer?  no  Who?  What age when diagnosed?    Is patient diabetic?   no      Does patient have prosthetic heart valve or mechanical valve?  no  Do you have a pacemaker?  no  Has patient ever had endocarditis? no  Has patient had joint replacement within last 12 months?  no  Is patient constipated or do they take laxatives? no  Does patient have a history of alcohol/drug use?  no  Is patient on blood thinner such as Coumadin, Plavix and/or Aspirin? no  Medications: tramadol 50 mg qid as needed, clonzepam 1 mg daily, escitalopram 20 mg daily, estradiol 20 mg daily  Allergies: pcn  Medication Adjustment per Dr Lindi Adie, NP:   Procedure date & time: 11/05/17 at 830

## 2017-10-11 DIAGNOSIS — G4733 Obstructive sleep apnea (adult) (pediatric): Secondary | ICD-10-CM | POA: Diagnosis not present

## 2017-10-12 DIAGNOSIS — G4733 Obstructive sleep apnea (adult) (pediatric): Secondary | ICD-10-CM | POA: Diagnosis not present

## 2017-10-28 NOTE — Patient Instructions (Signed)
[name]  10/28/2017     @PREFPERIOPPHARMACY @   Your procedure is scheduled on 11/05/2017.  Report to [facility] at 7:00 A.M.  Call this number if you have problems the morning of surgery:  [phone]   Remember:  Do not eat or drink after midnight. FOLLOW ALL INSTRUCTIONS GIVEN TO YOU BY DR. Olevia Perches OFFICE AND CONTACT OFFICE WITH ANY QUESTIONS  REGARDING PREP      Take these medicines the morning of surgery with A SIP OF WATER Klonopin, Lexapro, Estridol, Oxycodone OR Ultram if needed    Do not wear jewelry, make-up or nail polish.  Do not wear lotions, powders, or perfumes, or deodorant.  Do not shave 48 hours prior to surgery.  Men may shave face and neck.  Do not bring valuables to the hospital.  [facility] is not responsible for any belongings or valuables.  Contacts, dentures or bridgework may not be worn into surgery.  Leave your suitcase in the car.  After surgery it may be brought to your room.  For patients admitted to the hospital, discharge time will be determined by your treatment team.  Patients discharged the day of surgery will not be allowed to drive home.    Please read over the following fact sheets that you were given. Anesthesia Post-op Instructions     PATIENT INSTRUCTIONS POST-ANESTHESIA  IMMEDIATELY FOLLOWING SURGERY:  Do not drive or operate machinery for the first twenty four hours after surgery.  Do not make any important decisions for twenty four hours after surgery or while taking narcotic pain medications or sedatives.  If you develop intractable nausea and vomiting or a severe headache please notify your doctor immediately.  FOLLOW-UP:  Please make an appointment with your surgeon as instructed. You do not need to follow up with anesthesia unless specifically instructed to do so.  WOUND CARE INSTRUCTIONS (if applicable):  Keep a dry clean dressing on the anesthesia/puncture wound site if there is drainage.  Once the wound has quit  draining you may leave it open to air.  Generally you should leave the bandage intact for twenty four hours unless there is drainage.  If the epidural site drains for more than 36-48 hours please call the anesthesia department.  QUESTIONS?:  Please feel free to call your physician or the hospital operator if you have any questions, and they will be happy to assist you.      Colonoscopy, Adult A colonoscopy is an exam to look at the entire large intestine. During the exam, a lubricated, bendable tube is inserted into the anus and then passed into the rectum, colon, and other parts of the large intestine. A colonoscopy is often done as a part of normal colorectal screening or in response to certain symptoms, such as anemia, persistent diarrhea, abdominal pain, and blood in the stool. The exam can help screen for and diagnose medical problems, including:  Tumors.  Polyps.  Inflammation.  Areas of bleeding.  Tell a health care provider about:  Any allergies you have.  All medicines you are taking, including vitamins, herbs, eye drops, creams, and over-the-counter medicines.  Any problems you or family members have had with anesthetic medicines.  Any blood disorders you have.  Any surgeries you have had.  Any medical conditions you have.  Any problems you have had passing stool. What are the risks? Generally, this is a safe procedure. However, problems may occur, including:  Bleeding.  A tear in the intestine.  A  reaction to medicines given during the exam.  Infection (rare).  What happens before the procedure? Eating and drinking restrictions Follow instructions from your health care provider about eating and drinking, which may include:  A few days before the procedure - follow a low-fiber diet. Avoid nuts, seeds, dried fruit, raw fruits, and vegetables.  1-3 days before the procedure - follow a clear liquid diet. Drink only clear liquids, such as clear broth or  bouillon, black coffee or tea, clear juice, clear soft drinks or sports drinks, gelatin dessert, and popsicles. Avoid any liquids that contain red or purple dye.  On the day of the procedure - do not eat or drink anything during the 2 hours before the procedure, or within the time period that your health care provider recommends.  Bowel prep If you were prescribed an oral bowel prep to clean out your colon:  Take it as told by your health care provider. Starting the day before your procedure, you will need to drink a large amount of medicated liquid. The liquid will cause you to have multiple loose stools until your stool is almost clear or light green.  If your skin or anus gets irritated from diarrhea, you may use these to relieve the irritation: ? Medicated wipes, such as adult wet wipes with aloe and vitamin E. ? A skin soothing-product like petroleum jelly.  If you vomit while drinking the bowel prep, take a break for up to 60 minutes and then begin the bowel prep again. If vomiting continues and you cannot take the bowel prep without vomiting, call your health care provider.  General instructions  Ask your health care provider about changing or stopping your regular medicines. This is especially important if you are taking diabetes medicines or blood thinners.  Plan to have someone take you home from the hospital or clinic. What happens during the procedure?  An IV tube may be inserted into one of your veins.  You will be given medicine to help you relax (sedative).  To reduce your risk of infection: ? Your health care team will wash or sanitize their hands. ? Your anal area will be washed with soap.  You will be asked to lie on your side with your knees bent.  Your health care provider will lubricate a long, thin, flexible tube. The tube will have a camera and a light on the end.  The tube will be inserted into your anus.  The tube will be gently eased through your rectum  and colon.  Air will be delivered into your colon to keep it open. You may feel some pressure or cramping.  The camera will be used to take images during the procedure.  A small tissue sample may be removed from your body to be examined under a microscope (biopsy). If any potential problems are found, the tissue will be sent to a lab for testing.  If small polyps are found, your health care provider may remove them and have them checked for cancer cells.  The tube that was inserted into your anus will be slowly removed. The procedure may vary among health care providers and hospitals. What happens after the procedure?  Your blood pressure, heart rate, breathing rate, and blood oxygen level will be monitored until the medicines you were given have worn off.  Do not drive for 24 hours after the exam.  You may have a small amount of blood in your stool.  You may pass gas and have mild  abdominal cramping or bloating due to the air that was used to inflate your colon during the exam.  It is up to you to get the results of your procedure. Ask your health care provider, or the department performing the procedure, when your results will be ready. This information is not intended to replace advice given to you by your health care provider. Make sure you discuss any questions you have with your health care provider. Document Released: 03/27/2000 Document Revised: 01/29/2016 Document Reviewed: 06/11/2015 Elsevier Interactive Patient Education  2018 [employer].

## 2017-10-29 ENCOUNTER — Encounter (HOSPITAL_COMMUNITY)
Admission: RE | Admit: 2017-10-29 | Discharge: 2017-10-29 | Disposition: A | Discharge status: Home / Self Care | Payer: 59 | Source: Ambulatory Visit | Attending: Internal Medicine | Admitting: Internal Medicine

## 2017-10-29 ENCOUNTER — Other Ambulatory Visit: Payer: Self-pay

## 2017-10-29 ENCOUNTER — Encounter (HOSPITAL_COMMUNITY): Payer: Self-pay

## 2017-10-29 DIAGNOSIS — Z1211 Encounter for screening for malignant neoplasm of colon: Secondary | ICD-10-CM

## 2017-10-29 DIAGNOSIS — Z01818 Encounter for other preprocedural examination: Secondary | ICD-10-CM | POA: Insufficient documentation

## 2017-10-29 HISTORY — DX: Nausea with vomiting, unspecified: R11.2

## 2017-10-29 HISTORY — DX: Personal history of urinary calculi: Z87.442

## 2017-10-29 HISTORY — DX: Sleep apnea, unspecified: G47.30

## 2017-10-29 HISTORY — DX: Gastro-esophageal reflux disease without esophagitis: K21.9

## 2017-10-29 HISTORY — DX: Other specified postprocedural states: Z98.890

## 2017-10-29 LAB — CBC
HCT: 37.4 % (ref 36.0–46.0)
HEMOGLOBIN: 12.3 g/dL (ref 12.0–15.0)
MCH: 30.1 pg (ref 26.0–34.0)
MCHC: 32.9 g/dL (ref 30.0–36.0)
MCV: 91.7 fL (ref 78.0–100.0)
PLATELETS: 275 10*3/uL (ref 150–400)
RBC: 4.08 MIL/uL (ref 3.87–5.11)
RDW: 12 % (ref 11.5–15.5)
WBC: 7.7 10*3/uL (ref 4.0–10.5)

## 2017-10-29 LAB — BASIC METABOLIC PANEL
ANION GAP: 7 (ref 5–15)
BUN: 13 mg/dL (ref 6–20)
CHLORIDE: 103 mmol/L (ref 98–111)
CO2: 28 mmol/L (ref 22–32)
Calcium: 8.9 mg/dL (ref 8.9–10.3)
Creatinine, Ser: 0.82 mg/dL (ref 0.44–1.00)
Glucose, Bld: 90 mg/dL (ref 70–99)
POTASSIUM: 3.8 mmol/L (ref 3.5–5.1)
SODIUM: 138 mmol/L (ref 135–145)

## 2017-11-05 ENCOUNTER — Encounter (HOSPITAL_COMMUNITY)
Admission: RE | Disposition: A | Discharge status: Home / Self Care | Payer: Self-pay | Source: Ambulatory Visit | Attending: Internal Medicine

## 2017-11-05 ENCOUNTER — Ambulatory Visit (HOSPITAL_COMMUNITY)
Admission: RE | Admit: 2017-11-05 | Discharge: 2017-11-05 | Disposition: A | Discharge status: Home / Self Care | Payer: 59 | Source: Ambulatory Visit | Attending: Internal Medicine | Admitting: Internal Medicine

## 2017-11-05 ENCOUNTER — Ambulatory Visit (HOSPITAL_COMMUNITY): Payer: 59 | Admitting: Anesthesiology

## 2017-11-05 ENCOUNTER — Encounter (HOSPITAL_COMMUNITY): Payer: Self-pay

## 2017-11-05 ENCOUNTER — Other Ambulatory Visit: Payer: Self-pay

## 2017-11-05 DIAGNOSIS — Z1211 Encounter for screening for malignant neoplasm of colon: Secondary | ICD-10-CM

## 2017-11-05 DIAGNOSIS — Z801 Family history of malignant neoplasm of trachea, bronchus and lung: Secondary | ICD-10-CM | POA: Diagnosis not present

## 2017-11-05 DIAGNOSIS — K219 Gastro-esophageal reflux disease without esophagitis: Secondary | ICD-10-CM | POA: Insufficient documentation

## 2017-11-05 DIAGNOSIS — Z90710 Acquired absence of both cervix and uterus: Secondary | ICD-10-CM | POA: Insufficient documentation

## 2017-11-05 DIAGNOSIS — M329 Systemic lupus erythematosus, unspecified: Secondary | ICD-10-CM | POA: Diagnosis not present

## 2017-11-05 DIAGNOSIS — Z87442 Personal history of urinary calculi: Secondary | ICD-10-CM | POA: Insufficient documentation

## 2017-11-05 DIAGNOSIS — K5730 Diverticulosis of large intestine without perforation or abscess without bleeding: Secondary | ICD-10-CM | POA: Insufficient documentation

## 2017-11-05 DIAGNOSIS — K6289 Other specified diseases of anus and rectum: Secondary | ICD-10-CM

## 2017-11-05 DIAGNOSIS — K644 Residual hemorrhoidal skin tags: Secondary | ICD-10-CM

## 2017-11-05 DIAGNOSIS — Z8249 Family history of ischemic heart disease and other diseases of the circulatory system: Secondary | ICD-10-CM | POA: Insufficient documentation

## 2017-11-05 DIAGNOSIS — G43909 Migraine, unspecified, not intractable, without status migrainosus: Secondary | ICD-10-CM | POA: Diagnosis not present

## 2017-11-05 DIAGNOSIS — Z833 Family history of diabetes mellitus: Secondary | ICD-10-CM | POA: Insufficient documentation

## 2017-11-05 DIAGNOSIS — F329 Major depressive disorder, single episode, unspecified: Secondary | ICD-10-CM | POA: Diagnosis not present

## 2017-11-05 DIAGNOSIS — Z9049 Acquired absence of other specified parts of digestive tract: Secondary | ICD-10-CM | POA: Diagnosis not present

## 2017-11-05 DIAGNOSIS — D129 Benign neoplasm of anus and anal canal: Secondary | ICD-10-CM | POA: Insufficient documentation

## 2017-11-05 DIAGNOSIS — G4730 Sleep apnea, unspecified: Secondary | ICD-10-CM | POA: Insufficient documentation

## 2017-11-05 DIAGNOSIS — D125 Benign neoplasm of sigmoid colon: Secondary | ICD-10-CM | POA: Diagnosis not present

## 2017-11-05 DIAGNOSIS — D649 Anemia, unspecified: Secondary | ICD-10-CM | POA: Insufficient documentation

## 2017-11-05 DIAGNOSIS — F419 Anxiety disorder, unspecified: Secondary | ICD-10-CM | POA: Diagnosis not present

## 2017-11-05 DIAGNOSIS — Z880 Allergy status to penicillin: Secondary | ICD-10-CM | POA: Diagnosis not present

## 2017-11-05 DIAGNOSIS — M797 Fibromyalgia: Secondary | ICD-10-CM | POA: Diagnosis not present

## 2017-11-05 DIAGNOSIS — G8929 Other chronic pain: Secondary | ICD-10-CM | POA: Diagnosis not present

## 2017-11-05 DIAGNOSIS — Z79899 Other long term (current) drug therapy: Secondary | ICD-10-CM | POA: Insufficient documentation

## 2017-11-05 HISTORY — DX: Anxiety disorder, unspecified: F41.9

## 2017-11-05 HISTORY — PX: POLYPECTOMY: SHX5525

## 2017-11-05 HISTORY — PX: COLONOSCOPY WITH PROPOFOL: SHX5780

## 2017-11-05 HISTORY — DX: Anemia, unspecified: D64.9

## 2017-11-05 SURGERY — COLONOSCOPY WITH PROPOFOL
Anesthesia: General

## 2017-11-05 MED ORDER — CHLORHEXIDINE GLUCONATE CLOTH 2 % EX PADS: 6.0000 | MEDICATED_PAD | Freq: Once | CUTANEOUS | Status: DC

## 2017-11-05 MED ORDER — HYDROCODONE-ACETAMINOPHEN 7.5-325 MG PO TABS: 1.0000 | ORAL_TABLET | Freq: Once | ORAL | Status: DC | PRN

## 2017-11-05 MED ORDER — MEPERIDINE HCL 100 MG/ML IJ SOLN: 6.2500 mg | INTRAMUSCULAR | Status: DC | PRN

## 2017-11-05 MED ORDER — LACTATED RINGERS IV SOLN
INTRAVENOUS | Status: DC
  Administered 2017-11-05: 08:00:00 via INTRAVENOUS

## 2017-11-05 MED ORDER — MIDAZOLAM HCL 5 MG/5ML IJ SOLN
INTRAMUSCULAR | Status: DC | PRN
  Administered 2017-11-05: 2 mg via INTRAVENOUS

## 2017-11-05 MED ORDER — PROPOFOL 10 MG/ML IV BOLUS
INTRAVENOUS | Status: AC
  Filled 2017-11-05: qty 20

## 2017-11-05 MED ORDER — ONDANSETRON HCL 4 MG/2ML IJ SOLN: 4.0000 mg | Freq: Once | INTRAMUSCULAR | Status: DC | PRN

## 2017-11-05 MED ORDER — HYDROMORPHONE HCL 1 MG/ML IJ SOLN: 0.2500 mg | INTRAMUSCULAR | Status: DC | PRN

## 2017-11-05 MED ORDER — PROPOFOL 500 MG/50ML IV EMUL
INTRAVENOUS | Status: DC | PRN
  Administered 2017-11-05: 09:00:00 via INTRAVENOUS
  Administered 2017-11-05: 150 ug/kg/min via INTRAVENOUS

## 2017-11-05 NOTE — H&P (Signed)
[name] is an 57 y.o. female.   Chief Complaint: Patient is here for colonoscopy. HPI: Patient is a 57 year old Caucasian female who is here for screening colonoscopy.  This is patient's first exam.  She denies abdominal pain change in bowel habits or rectal bleeding. She does not take aspirin or  anticoagulants. Family history is negative for CRC.  Past Medical History:  Diagnosis Date  . Anemia    2002 after having hysterectomy  . Anxiety   . Chronic pain   . Depression   . Fibromyalgia   . GERD (gastroesophageal reflux disease)   . History of kidney stones   . Lupus ([place])   . Migraine headache   . PONV (postoperative nausea and vomiting)   . Sleep apnea    "Not enough for a CPAP".    Past Surgical History:  Procedure Laterality Date  . APPENDECTOMY    . BREAST CYST ASPIRATION Left   . CARDIAC CATHETERIZATION     x 2 and both were normal  . CHOLECYSTECTOMY    . complete hysterectomy    . TONSILLECTOMY    . TUBAL LIGATION      Family History  Problem Relation Age of Onset  . Hypertension Mother   . Diabetes Father   . Hypertension Sister   . Cancer Brother        lung  . Hypertension Brother   . Hypertension Sister   . Hypertension Brother   . Diabetes Brother   . Hypertension Sister   . Hypertension Brother    Social History:  reports that she has never smoked. She has never used smokeless tobacco. She reports that she does not drink alcohol or use drugs.  Allergies:  Allergies  Allergen Reactions  . Penicillins Swelling and Other (See Comments)    Blisters     Medications Prior to Admission  Medication Sig Dispense Refill  . Cholecalciferol (VITAMIN D PO) Take 1 tablet by mouth daily.    . clonazePAM (KLONOPIN) 1 MG tablet Take 1 mg by mouth at bedtime.    Marland Kitchen escitalopram (LEXAPRO) 20 MG tablet Take 20 mg by mouth daily.    Marland Kitchen estradiol (ESTRACE) 1 MG tablet Take 1 mg by mouth daily.    Nyoka Cowden Tea, Camellia sinensis, (GREEN TEA PO) Take 1  tablet by mouth daily.    . MethylPREDNISolone Acetate (DEPO-MEDROL, PF, IJ) Inject as directed every 3 (three) months. Reported on 06/07/2015    . naproxen sodium (ALEVE) 220 MG tablet Take 220 mg by mouth 2 (two) times daily as needed (Pain). Reported on 06/07/2015    . oxycodone-acetaminophen (LYNOX) 10-300 MG per tablet Take 1 tablet by mouth every 4 (four) hours as needed for pain. Reported on 06/07/2015    . traMADol (ULTRAM) 50 MG tablet Take 50 mg by mouth every 6 (six) hours as needed for pain.    Marland Kitchen VITAMIN E PO Take 1 tablet by mouth daily.      No results found for this or any previous visit (from the past 48 hour(s)). No results found.  ROS  Blood pressure 115/71, pulse 72, temperature 98.1 F (36.7 C), temperature source Oral, resp. rate 18, height 5\' 3"  (1.6 m), weight 143 lb (64.9 kg), SpO2 97 %. Physical Exam  Constitutional: She appears well-developed and well-nourished.  HENT:  Mouth/Throat: Oropharynx is clear and moist.  Eyes: Conjunctivae are normal. No scleral icterus.  Neck: No thyromegaly present.  Cardiovascular: Normal rate, regular rhythm and normal  heart sounds.  No murmur heard. Respiratory: Effort normal and breath sounds normal.  GI: Soft. She exhibits no distension and no mass. There is no tenderness.  Appendectomy and Pfannenstiel scars.  Musculoskeletal: She exhibits no edema.  Lymphadenopathy:    She has no cervical adenopathy.  Neurological: She is alert.  Skin: Skin is warm and dry.     Assessment/Plan Average risk screening colonoscopy.  Hildred Laser, MD 11/05/2017, 7:28 AM

## 2017-11-05 NOTE — Addendum Note (Signed)
Addendum  created 11/05/17 1412 by Charmaine Downs, CRNA   Charge Capture section accepted

## 2017-11-05 NOTE — Op Note (Signed)
[facility] Patient Name: [name] Procedure Date: 11/05/2017 8:11 AM MRN: [id number] Date of Birth: Jun 15, 1960 Attending MD: Hildred Laser , MD CSN: [id number] Age: 57 Admit Type: Outpatient Procedure:                Colonoscopy Indications:              Screening for colorectal malignant neoplasm Providers:                Hildred Laser, MD, Lurline Del, RN, Randa Spike,                            Technician Referring MD:             Redmond School, MD Medicines:                Propofol per Anesthesia Complications:            No immediate complications. Estimated Blood Loss:     Estimated blood loss was minimal. Procedure:                Pre-Anesthesia Assessment:                           - Prior to the procedure, a History and Physical                            was performed, and patient medications and                            allergies were reviewed. The patient's tolerance of                            previous anesthesia was also reviewed. The risks                            and benefits of the procedure and the sedation                            options and risks were discussed with the patient.                            All questions were answered, and informed consent                            was obtained. Prior Anticoagulants: The patient has                            taken no previous anticoagulant or antiplatelet                            agents. ASA Grade Assessment: II - A patient with                            mild systemic disease. After reviewing the risks  and benefits, the patient was deemed in                            satisfactory condition to undergo the procedure.                           After obtaining informed consent, the colonoscope                            was passed under direct vision. Throughout the                            procedure, the patient's blood pressure, pulse, and        oxygen saturations were monitored continuously. The                            PCF-[id number] ([id number]) was introduced through the and                            advanced to the the cecum, identified by                            appendiceal orifice and ileocecal valve. The                            colonoscopy was somewhat difficult due to                            significant looping. Successful completion of the                            procedure was aided by using manual pressure and                            withdrawing and reinserting the scope and using                            scope guide.. The quality of the bowel preparation                            was adequate. The ileocecal valve, appendiceal                            orifice, and rectum were photographed. Scope In: 8:37:18 AM Scope Out: 9:05:32 AM Scope Withdrawal Time: 0 hours 11 minutes 52 seconds  Total Procedure Duration: 0 hours 28 minutes 14 seconds  Findings:      The perianal and digital rectal examinations were normal.      Scattered medium-mouthed diverticula were found in the sigmoid colon and       hepatic flexure.      A 5 mm polyp was found in the distal sigmoid colon. The polyp was       sessile. The polyp was removed with a cold snare. Resection and       retrieval were complete.  External hemorrhoids were found during retroflexion. The hemorrhoids       were small.      Anal papilla(e) were hypertrophied. Impression:               - Diverticulosis in the sigmoid colon and at the                            hepatic flexure.                           - One 5 mm polyp in the distal sigmoid colon,                            removed with a cold snare. Resected and retrieved.                           - External hemorrhoids.                           - Anal papilla(e) were hypertrophied. Moderate Sedation:      Per Anesthesia Care Recommendation:           - Patient has a contact number  available for                            emergencies. The signs and symptoms of potential                            delayed complications were discussed with the                            patient. Return to normal activities tomorrow.                            Written discharge instructions were provided to the                            patient.                           - High fiber diet today.                           - Continue present medications.                           - No aspirin, ibuprofen, naproxen, or other                            non-steroidal anti-inflammatory drugs for 1 day.                           - Await pathology results.                           - Repeat colonoscopy is recommended. The  colonoscopy date will be determined after pathology                            results from today's exam become available for                            review. Procedure Code(s):        --- Professional ---                           [phone], Colonoscopy, flexible; with removal of                            tumor(s), polyp(s), or other lesion(s) by snare                            technique Diagnosis Code(s):        --- Professional ---                           Z12.11, Encounter for screening for malignant                            neoplasm of colon                           K64.4, Residual hemorrhoidal skin tags                           D12.5, Benign neoplasm of sigmoid colon                           K62.89, Other specified diseases of anus and rectum                           K57.30, Diverticulosis of large intestine without                            perforation or abscess without bleeding CPT copyright 2017 American Medical Association. All rights reserved. The codes documented in this report are preliminary and upon coder review may  be revised to meet current compliance requirements. Hildred Laser, MD Hildred Laser, MD 11/05/2017 9:13:02  AM This report has been signed electronically. Number of Addenda: 0

## 2017-11-05 NOTE — Anesthesia Postprocedure Evaluation (Signed)
Anesthesia Post Note  Patient: [place]  Procedure(s) Performed: COLONOSCOPY WITH PROPOFOL (N/A ) POLYPECTOMY  Patient location during evaluation: PACU Anesthesia Type: General Level of consciousness: awake and alert Pain management: pain level controlled Vital Signs Assessment: post-procedure vital signs reviewed and stable Respiratory status: spontaneous breathing, nonlabored ventilation, respiratory function stable and patient connected to nasal cannula oxygen Cardiovascular status: stable and blood pressure returned to baseline Postop Assessment: no apparent nausea or vomiting Anesthetic complications: no     Last Vitals:  Vitals:   11/05/17 0930 11/05/17 0942  BP: 115/67 118/72  Pulse: (!) 58 70  Resp: 17 18  Temp:  36.4 C  SpO2: 99% 99%    Last Pain:  Vitals:   11/05/17 0942  TempSrc: Oral  PainSc: [place] Mayla Biddy

## 2017-11-05 NOTE — Transfer of Care (Signed)
Immediate Anesthesia Transfer of Care Note  Patient: [name]  Procedure(s) Performed: COLONOSCOPY WITH PROPOFOL (N/A ) POLYPECTOMY  Patient Location: PACU  Anesthesia Type:MAC  Level of Consciousness: awake and patient cooperative  Airway & Oxygen Therapy: Patient Spontanous Breathing  Post-op Assessment: Report given to RN, Post -op Vital signs reviewed and stable and Patient moving all extremities  Post vital signs: Reviewed and stable  Last Vitals:  Vitals Value Taken Time  BP    Temp    Pulse    Resp    SpO2      Last Pain:  Vitals:   11/05/17 0716  TempSrc: Oral  PainSc: 3       Patients Stated Pain Goal: 6 (37/48/27 0786)  Complications: No apparent anesthesia complications

## 2017-11-05 NOTE — Discharge Instructions (Signed)
No aspirin or NSAIDs for 24 hours. Resume usual medications as before.. High-fiber diet. No driving for 24 hours. Physician will call with biopsy results.       Colonoscopy, Adult, Care After This sheet gives you information about how to care for yourself after your procedure. Your doctor may also give you more specific instructions. If you have problems or questions, call your doctor. Follow these instructions at home: General instructions   For the first 24 hours after the procedure: ? Do not drive or use machinery. ? Do not sign important documents. ? Do not drink alcohol. ? Do your daily activities more slowly than normal. ? Eat foods that are soft and easy to digest. ? Rest often.  Take over-the-counter or prescription medicines only as told by your doctor.  It is up to you to get the results of your procedure. Ask your doctor, or the department performing the procedure, when your results will be ready. To help cramping and bloating:  Try walking around.  Put heat on your belly (abdomen) as told by your doctor. Use a heat source that your doctor recommends, such as a moist heat pack or a heating pad. ? Put a towel between your skin and the heat source. ? Leave the heat on for 20-30 minutes. ? Remove the heat if your skin turns bright red. This is especially important if you cannot feel pain, heat, or cold. You can get burned. Eating and drinking  Drink enough fluid to keep your pee (urine) clear or pale yellow.  Return to your normal diet as told by your doctor. Avoid heavy or fried foods that are hard to digest.  Avoid drinking alcohol for as long as told by your doctor. Contact a doctor if:  You have blood in your poop (stool) 2-3 days after the procedure. Get help right away if:  You have more than a small amount of blood in your poop.  You see large clumps of tissue (blood clots) in your poop.  Your belly is swollen.  You feel sick to your stomach  (nauseous).  You throw up (vomit).  You have a fever.  You have belly pain that gets worse, and medicine does not help your pain. This information is not intended to replace advice given to you by your health care provider. Make sure you discuss any questions you have with your health care provider. Document Released: 05/02/2010 Document Revised: 12/23/2015 Document Reviewed: 12/23/2015 Elsevier Interactive Patient Education  2017 [employer]       Diverticulosis Diverticulosis is a condition that develops when small pouches (diverticula) form in the wall of the large intestine (colon). The colon is where water is absorbed and stool is formed. The pouches form when the inside layer of the colon pushes through weak spots in the outer layers of the colon. You may have a few pouches or many of them. What are the causes? The cause of this condition is not known. What increases the risk? The following factors may make you more likely to develop this condition:  Being older than age 69. Your risk for this condition increases with age. Diverticulosis is rare among people younger than age 23. By age 57, many people have it.  Eating a low-fiber diet.  Having frequent constipation.  Being overweight.  Not getting enough exercise.  Smoking.  Taking over-the-counter pain medicines, like aspirin and ibuprofen.  Having a family history of diverticulosis.  What are the signs or symptoms? In most people,  there are no symptoms of this condition. If you do have symptoms, they may include:  Bloating.  Cramps in the abdomen.  Constipation or diarrhea.  Pain in the lower left side of the abdomen.  How is this diagnosed? This condition is most often diagnosed during an exam for other colon problems. Because diverticulosis usually has no symptoms, it often cannot be diagnosed independently. This condition may be diagnosed by:  Using a flexible scope to examine the colon  (colonoscopy).  Taking an X-ray of the colon after dye has been put into the colon (barium enema).  Doing a CT scan.  How is this treated? You may not need treatment for this condition if you have never developed an infection related to diverticulosis. If you have had an infection before, treatment may include:  Eating a high-fiber diet. This may include eating more fruits, vegetables, and grains.  Taking a fiber supplement.  Taking a live bacteria supplement (probiotic).  Taking medicine to relax your colon.  Taking antibiotic medicines.  Follow these instructions at home:  Drink 6-8 glasses of water or more each day to prevent constipation.  Try not to strain when you have a bowel movement.  If you have had an infection before: ? Eat more fiber as directed by your health care provider or your diet and nutrition specialist (dietitian). ? Take a fiber supplement or probiotic, if your health care provider approves.  Take over-the-counter and prescription medicines only as told by your health care provider.  If you were prescribed an antibiotic, take it as told by your health care provider. Do not stop taking the antibiotic even if you start to feel better.  Keep all follow-up visits as told by your health care provider. This is important. Contact a health care provider if:  You have pain in your abdomen.  You have bloating.  You have cramps.  You have not had a bowel movement in 3 days. Get help right away if:  Your pain gets worse.  Your bloating becomes very bad.  You have a fever or chills, and your symptoms suddenly get worse.  You vomit.  You have bowel movements that are bloody or black.  You have bleeding from your rectum. Summary  Diverticulosis is a condition that develops when small pouches (diverticula) form in the wall of the large intestine (colon).  You may have a few pouches or many of them.  This condition is most often diagnosed during an  exam for other colon problems.  If you have had an infection related to diverticulosis, treatment may include increasing the fiber in your diet, taking supplements, or taking medicines. This information is not intended to replace advice given to you by your health care provider. Make sure you discuss any questions you have with your health care provider. Document Released: 12/26/2003 Document Revised: 02/17/2016 Document Reviewed: 02/17/2016 Elsevier Interactive Patient Education  2017 [place].     High-Fiber Diet Fiber, also called dietary fiber, is a type of carbohydrate found in fruits, vegetables, whole grains, and beans. A high-fiber diet can have many health benefits. Your health care provider may recommend a high-fiber diet to help:  Prevent constipation. Fiber can make your bowel movements more regular.  Lower your cholesterol.  Relieve hemorrhoids, uncomplicated diverticulosis, or irritable bowel syndrome.  Prevent overeating as part of a weight-loss plan.  Prevent heart disease, type 2 diabetes, and certain cancers.  What is my plan? The recommended daily intake of fiber includes:  38  grams for men under age 53.  82 grams for men over age 47.  51 grams for women under age 89.  4 grams for women over age 54.  You can get the recommended daily intake of dietary fiber by eating a variety of fruits, vegetables, grains, and beans. Your health care provider may also recommend a fiber supplement if it is not possible to get enough fiber through your diet. What do I need to know about a high-fiber diet?  Fiber supplements have not been widely studied for their effectiveness, so it is better to get fiber through food sources.  Always check the fiber content on thenutrition facts label of any prepackaged food. Look for foods that contain at least 5 grams of fiber per serving.  Ask your dietitian if you have questions about specific foods that are related to your  condition, especially if those foods are not listed in the following section.  Increase your daily fiber consumption gradually. Increasing your intake of dietary fiber too quickly may cause bloating, cramping, or gas.  Drink plenty of water. Water helps you to digest fiber. What foods can I eat? Grains Whole-grain breads. Multigrain cereal. Oats and oatmeal. Brown rice. Barley. Bulgur wheat. [place]. Bran muffins. Popcorn. Rye wafer crackers. Vegetables Sweet potatoes. Spinach. Kale. Artichokes. Cabbage. Broccoli. Green peas. Carrots. Squash. Fruits Berries. Pears. Apples. Oranges. Avocados. Prunes and raisins. Dried figs. Meats and Other Protein Sources [place], kidney, pinto, and soy beans. Split peas. Lentils. Nuts and seeds. Dairy Fiber-fortified yogurt. Beverages Fiber-fortified soy milk. Fiber-fortified orange juice. Other Fiber bars. The items listed above may not be a complete list of recommended foods or beverages. Contact your dietitian for more options. What foods are not recommended? Grains White bread. Pasta made with refined flour. White rice. Vegetables Fried potatoes. Canned vegetables. Well-cooked vegetables. Fruits Fruit juice. Cooked, strained fruit. Meats and Other Protein Sources Fatty cuts of meat. Fried Sales executive or fried fish. Dairy Milk. Yogurt. Cream cheese. Sour cream. Beverages Soft drinks. Other Cakes and pastries. Butter and oils. The items listed above may not be a complete list of foods and beverages to avoid. Contact your dietitian for more information. What are some tips for including high-fiber foods in my diet?  Eat a wide variety of high-fiber foods.  Make sure that half of all grains consumed each day are whole grains.  Replace breads and cereals made from refined flour or white flour with whole-grain breads and cereals.  Replace white rice with brown rice, bulgur wheat, or millet.  Start the day with a breakfast that is high in fiber,  such as a cereal that contains at least 5 grams of fiber per serving.  Use beans in place of meat in soups, salads, or pasta.  Eat high-fiber snacks, such as berries, raw vegetables, nuts, or popcorn. This information is not intended to replace advice given to you by your health care provider. Make sure you discuss any questions you have with your health care provider. Document Released: 03/30/2005 Document Revised: 09/05/2015 Document Reviewed: 09/12/2013 Elsevier Interactive Patient Education  2018 [employer].     Hemorrhoids Hemorrhoids are swollen veins in and around the rectum or anus. There are two types of hemorrhoids:  Internal hemorrhoids. These occur in the veins that are just inside the rectum. They may poke through to the outside and become irritated and painful.  External hemorrhoids. These occur in the veins that are outside of the anus and can be felt as a painful swelling  or hard lump near the anus.  Most hemorrhoids do not cause serious problems, and they can be managed with home treatments such as diet and lifestyle changes. If home treatments do not help your symptoms, procedures can be done to shrink or remove the hemorrhoids. What are the causes? This condition is caused by increased pressure in the anal area. This pressure may result from various things, including:  Constipation.  Straining to have a bowel movement.  Diarrhea.  Pregnancy.  Obesity.  Sitting for long periods of time.  Heavy lifting or other activity that causes you to strain.  Anal sex.  What are the signs or symptoms? Symptoms of this condition include:  Pain.  Anal itching or irritation.  Rectal bleeding.  Leakage of stool (feces).  Anal swelling.  One or more lumps around the anus.  How is this diagnosed? This condition can often be diagnosed through a visual exam. Other exams or tests may also be done, such as:  Examination of the rectal area with a gloved hand  (digital rectal exam).  Examination of the anal canal using a small tube (anoscope).  A blood test, if you have lost a significant amount of blood.  A test to look inside the colon (sigmoidoscopy or colonoscopy).  How is this treated? This condition can usually be treated at home. However, various procedures may be done if dietary changes, lifestyle changes, and other home treatments do not help your symptoms. These procedures can help make the hemorrhoids smaller or remove them completely. Some of these procedures involve surgery, and others do not. Common procedures include:  Rubber band ligation. Rubber bands are placed at the base of the hemorrhoids to cut off the blood supply to them.  Sclerotherapy. Medicine is injected into the hemorrhoids to shrink them.  Infrared coagulation. A type of light energy is used to get rid of the hemorrhoids.  Hemorrhoidectomy surgery. The hemorrhoids are surgically removed, and the veins that supply them are tied off.  Stapled hemorrhoidopexy surgery. A circular stapling device is used to remove the hemorrhoids and use staples to cut off the blood supply to them.  Follow these instructions at home: Eating and drinking  Eat foods that have a lot of fiber in them, such as whole grains, beans, nuts, fruits, and vegetables. Ask your health care provider about taking products that have added fiber (fiber supplements).  Drink enough fluid to keep your urine clear or pale yellow. Managing pain and swelling  Take warm sitz baths for 20 minutes, 3-4 times a day to ease pain and discomfort.  If directed, apply ice to the affected area. Using ice packs between sitz baths may be helpful. ? Put ice in a plastic bag. ? Place a towel between your skin and the bag. ? Leave the ice on for 20 minutes, 2-3 times a day. General instructions  Take over-the-counter and prescription medicines only as told by your health care provider.  Use medicated creams or  suppositories as told.  Exercise regularly.  Go to the bathroom when you have the urge to have a bowel movement. Do not wait.  Avoid straining to have bowel movements.  Keep the anal area dry and clean. Use wet toilet paper or moist towelettes after a bowel movement.  Do not sit on the toilet for long periods of time. This increases blood pooling and pain. Contact a health care provider if:  You have increasing pain and swelling that are not controlled by treatment or medicine.  You have uncontrolled bleeding.  You have difficulty having a bowel movement, or you are unable to have a bowel movement.  You have pain or inflammation outside the area of the hemorrhoids. This information is not intended to replace advice given to you by your health care provider. Make sure you discuss any questions you have with your health care provider. Document Released: 03/27/2000 Document Revised: 08/28/2015 Document Reviewed: 12/12/2014 Elsevier Interactive Patient Education  [employer].

## 2017-11-05 NOTE — Anesthesia Preprocedure Evaluation (Signed)
Anesthesia Evaluation  Patient identified by MRN, date of birth, ID band Patient awake    Reviewed: Allergy & Precautions, H&P , NPO status , Patient's Chart, lab work & pertinent test results, reviewed documented beta blocker date and time   History of Anesthesia Complications (+) PONV and history of anesthetic complications  Airway Mallampati: II  TM Distance: >3 FB Neck ROM: full    Dental no notable dental hx.    Pulmonary neg pulmonary ROS, sleep apnea ,    Pulmonary exam normal breath sounds clear to auscultation       Cardiovascular Exercise Tolerance: Good negative cardio ROS   Rhythm:regular Rate:Normal     Neuro/Psych  Headaches, Anxiety Depression  Neuromuscular disease negative neurological ROS  negative psych ROS   GI/Hepatic negative GI ROS, Neg liver ROS, GERD  ,  Endo/Other  negative endocrine ROS  Renal/GU negative Renal ROS  negative genitourinary   Musculoskeletal   Abdominal   Peds  Hematology negative hematology ROS (+) anemia ,   Anesthesia Other Findings   Reproductive/Obstetrics negative OB ROS                             Anesthesia Physical Anesthesia Plan  ASA: II  Anesthesia Plan: General   Post-op Pain Management:    Induction:   PONV Risk Score and Plan: TIVA and Propofol infusion  Airway Management Planned:   Additional Equipment:   Intra-op Plan:   Post-operative Plan:   Informed Consent: I have reviewed the patients History and Physical, chart, labs and discussed the procedure including the risks, benefits and alternatives for the proposed anesthesia with the patient or authorized representative who has indicated his/her understanding and acceptance.   Dental Advisory Given  Plan Discussed with: CRNA  Anesthesia Plan Comments:         Anesthesia Quick Evaluation

## 2017-11-10 ENCOUNTER — Encounter (HOSPITAL_COMMUNITY): Payer: Self-pay | Admitting: Internal Medicine

## 2017-12-21 DIAGNOSIS — M2550 Pain in unspecified joint: Secondary | ICD-10-CM | POA: Diagnosis not present

## 2017-12-21 DIAGNOSIS — Z6825 Body mass index (BMI) 25.0-25.9, adult: Secondary | ICD-10-CM | POA: Diagnosis not present

## 2017-12-21 DIAGNOSIS — L301 Dyshidrosis [pompholyx]: Secondary | ICD-10-CM | POA: Diagnosis not present

## 2017-12-21 DIAGNOSIS — M1991 Primary osteoarthritis, unspecified site: Secondary | ICD-10-CM | POA: Diagnosis not present

## 2018-01-10 DIAGNOSIS — L570 Actinic keratosis: Secondary | ICD-10-CM | POA: Diagnosis not present

## 2018-01-10 DIAGNOSIS — L298 Other pruritus: Secondary | ICD-10-CM | POA: Diagnosis not present

## 2018-01-10 DIAGNOSIS — L301 Dyshidrosis [pompholyx]: Secondary | ICD-10-CM | POA: Diagnosis not present

## 2018-03-22 DIAGNOSIS — M329 Systemic lupus erythematosus, unspecified: Secondary | ICD-10-CM | POA: Diagnosis not present

## 2018-03-22 DIAGNOSIS — G4709 Other insomnia: Secondary | ICD-10-CM | POA: Diagnosis not present

## 2018-03-22 DIAGNOSIS — M797 Fibromyalgia: Secondary | ICD-10-CM | POA: Diagnosis not present

## 2018-03-22 DIAGNOSIS — L309 Dermatitis, unspecified: Secondary | ICD-10-CM | POA: Diagnosis not present

## 2018-05-16 DIAGNOSIS — J029 Acute pharyngitis, unspecified: Secondary | ICD-10-CM | POA: Diagnosis not present

## 2018-05-16 DIAGNOSIS — R0981 Nasal congestion: Secondary | ICD-10-CM | POA: Diagnosis not present

## 2018-05-16 DIAGNOSIS — Z1389 Encounter for screening for other disorder: Secondary | ICD-10-CM | POA: Diagnosis not present

## 2018-05-16 DIAGNOSIS — R300 Dysuria: Secondary | ICD-10-CM | POA: Diagnosis not present

## 2018-05-23 DIAGNOSIS — M2550 Pain in unspecified joint: Secondary | ICD-10-CM | POA: Diagnosis not present

## 2018-06-14 DIAGNOSIS — Z6825 Body mass index (BMI) 25.0-25.9, adult: Secondary | ICD-10-CM | POA: Diagnosis not present

## 2018-06-14 DIAGNOSIS — L503 Dermatographic urticaria: Secondary | ICD-10-CM | POA: Diagnosis not present

## 2018-06-14 DIAGNOSIS — E663 Overweight: Secondary | ICD-10-CM | POA: Diagnosis not present

## 2018-07-19 ENCOUNTER — Ambulatory Visit (HOSPITAL_COMMUNITY)
Admission: RE | Admit: 2018-07-19 | Discharge: 2018-07-19 | Disposition: A | Discharge status: Home / Self Care | Payer: 59 | Source: Ambulatory Visit | Attending: Internal Medicine | Admitting: Internal Medicine

## 2018-07-19 ENCOUNTER — Other Ambulatory Visit: Payer: Self-pay

## 2018-07-19 ENCOUNTER — Other Ambulatory Visit: Payer: Self-pay | Admitting: Internal Medicine

## 2018-07-19 ENCOUNTER — Other Ambulatory Visit (HOSPITAL_COMMUNITY): Payer: Self-pay | Admitting: Internal Medicine

## 2018-07-19 DIAGNOSIS — E663 Overweight: Secondary | ICD-10-CM | POA: Diagnosis not present

## 2018-07-19 DIAGNOSIS — Z1389 Encounter for screening for other disorder: Secondary | ICD-10-CM | POA: Diagnosis not present

## 2018-07-19 DIAGNOSIS — R51 Headache: Secondary | ICD-10-CM | POA: Diagnosis not present

## 2018-07-19 DIAGNOSIS — R519 Headache, unspecified: Secondary | ICD-10-CM

## 2018-07-19 DIAGNOSIS — Z6825 Body mass index (BMI) 25.0-25.9, adult: Secondary | ICD-10-CM | POA: Diagnosis not present

## 2018-07-19 DIAGNOSIS — H532 Diplopia: Secondary | ICD-10-CM | POA: Diagnosis not present

## 2018-07-19 DIAGNOSIS — R202 Paresthesia of skin: Secondary | ICD-10-CM | POA: Diagnosis not present

## 2018-07-19 DIAGNOSIS — Z0001 Encounter for general adult medical examination with abnormal findings: Secondary | ICD-10-CM | POA: Diagnosis not present

## 2018-07-19 DIAGNOSIS — M797 Fibromyalgia: Secondary | ICD-10-CM | POA: Diagnosis not present

## 2018-08-03 ENCOUNTER — Encounter: Payer: Self-pay | Admitting: Neurology

## 2018-10-10 NOTE — Progress Notes (Signed)
NEUROLOGY CONSULTATION NOTE  [name] MRN: [id number] DOB: 1960/05/03  Referring provider: Redmond School, MD Primary care provider:Lawrence Gerarda Fraction, MD  Reason for consult:  headaches  HISTORY OF PRESENT ILLNESS: [name] is a 58 year old woman with migraines, fibromyalgia vs questionable lupus and depression who presents for headaches.  History supplemented by referring provider note.  Onset:  58 years old.  They have been worse over past few minutes.  She has been having a constant pressure headache on top Location:  Usually left sided and behind left eye.   Quality:  pounding Intensity:  Severe.  She denies severe or thunderclap headache Aura:  No Premonitory Phase:  Head pressure Postdrome:  Hangover effect for 2 to 3 days Associated symptoms:  Nausea, photophobia, phonophobia, blurred vision (more recent).  Sometimes vomiting.  She denies associated autonomic symptoms and unilateral numbness or weakness. Duration:  2 days Frequency:  3 a month Treats with ibuprofen or tylenol.  Occasional Aleve Frequency of abortive medication: at least once a day Triggers:  No Relieving factors:  Dark and quiet room Activity:  Aggravates.  She also has history of dizziness off and on, worse over past month.  She describes it as a spinning sensation.  Spontaneous onset.  It typically lasts several days.  They occur once a month.  Movement aggravates it.  Some nausea.  Worse getting up in morning.  When walking, she feels off-balance.  Current NSAIDS:  Ibuprofen, Aleve Current analgesics:  Tylenol, tramadol (2 daily for fibromyalgia) Current triptans:  none Current ergotamine:  none Current anti-emetic:  none Current muscle relaxants:  none Current anti-anxiolytic:  clonazepam Current sleep aide:  none Current Antihypertensive medications:  none Current Antidepressant medications:  Lexapro '20mg'$  Current Anticonvulsant medications:  none Current anti-CGRP:  none Current  Vitamins/Herbal/Supplements:  D, E Current Antihistamines/Decongestants:  none Other therapy:  none Hormone/birth control:  estradiol  Past NSAIDS:  none Past analgesics:  Excedrin Migraine Past abortive triptans:  Maxalt Past abortive ergotamine:  none Past muscle relaxants:  none Past anti-emetic:  Phenergan, Zofran (effective) Past antihypertensive medications:  none Past antidepressant medications:  none Past anticonvulsant medications:  none Past anti-CGRP:  none Past vitamins/Herbal/Supplements:  none Past antihistamines/decongestants:  none Other past therapies:  none  Caffeine:  6 oz coffee daily Diet:  Drinks at least 48-64 oz water daily.  No soda Exercise:  Not as much as she should.   Depression:  yes; Anxiety:  yes Other pain:  Chronic pain Sleep hygiene:  varies Family history of headache:  Sister (migraines), brother (migraines)  MRI of brain without contrast from 07/19/18 was personally reviewed and showed very mild nonspecific subcortical and periventricular punctate T2 and FLAIR hyperintense foci.  07/19/18 LABS:  CBC normal; CMP normal;  ESR normal at 4; TSH normal at 2.690  PAST MEDICAL HISTORY: Past Medical History:  Diagnosis Date  . Anemia    2002 after having hysterectomy  . Anxiety   . Chronic pain   . Depression   . Fibromyalgia   . GERD (gastroesophageal reflux disease)   . History of kidney stones   . Lupus ([place])   . Migraine headache   . PONV (postoperative nausea and vomiting)   . Sleep apnea    "Not enough for a CPAP".    PAST SURGICAL HISTORY: Past Surgical History:  Procedure Laterality Date  . APPENDECTOMY    . BREAST CYST ASPIRATION Left   . CARDIAC CATHETERIZATION     x 2  and both were normal  . CHOLECYSTECTOMY    . COLONOSCOPY WITH PROPOFOL N/A 11/05/2017   Procedure: COLONOSCOPY WITH PROPOFOL;  Surgeon: Rogene Houston, MD;  Location: [facility];  Service: Endoscopy;  Laterality: N/A;  8:30  . complete hysterectomy    .  POLYPECTOMY  11/05/2017   Procedure: POLYPECTOMY;  Surgeon: Rogene Houston, MD;  Location: [facility];  Service: Endoscopy;;  sigmoid  . TONSILLECTOMY    . TUBAL LIGATION      MEDICATIONS: Current Outpatient Medications on File Prior to Visit  Medication Sig Dispense Refill  . Cholecalciferol (VITAMIN D PO) Take 1 tablet by mouth daily.    . clonazePAM (KLONOPIN) 1 MG tablet Take 1 mg by mouth at bedtime.    Marland Kitchen escitalopram (LEXAPRO) 20 MG tablet Take 20 mg by mouth daily.    Marland Kitchen estradiol (ESTRACE) 1 MG tablet Take 1 mg by mouth daily.    Nyoka Cowden Tea, Camellia sinensis, (GREEN TEA PO) Take 1 tablet by mouth daily.    . MethylPREDNISolone Acetate (DEPO-MEDROL, PF, IJ) Inject as directed every 3 (three) months. Reported on 06/07/2015    . naproxen sodium (ALEVE) 220 MG tablet Take 220 mg by mouth 2 (two) times daily as needed (Pain). Reported on 06/07/2015    . oxycodone-acetaminophen (LYNOX) 10-300 MG per tablet Take 1 tablet by mouth every 4 (four) hours as needed for pain. Reported on 06/07/2015    . traMADol (ULTRAM) 50 MG tablet Take 50 mg by mouth every 6 (six) hours as needed for pain.    Marland Kitchen VITAMIN E PO Take 1 tablet by mouth daily.     No current facility-administered medications on file prior to visit.     ALLERGIES: Allergies  Allergen Reactions  . Penicillins Swelling and Other (See Comments)    Blisters     FAMILY HISTORY: Family History  Problem Relation Age of Onset  . Hypertension Mother   . Diabetes Father   . Hypertension Sister   . Cancer Brother        lung  . Hypertension Brother   . Hypertension Sister   . Hypertension Brother   . Diabetes Brother   . Hypertension Sister   . Hypertension Brother     SOCIAL HISTORY: Social History   Socioeconomic History  . Marital status: Married    Spouse name: Not on file  . Number of children: Not on file  . Years of education: Not on file  . Highest education level: Not on file  Occupational History  . Not  on file  Social Needs  . Financial resource strain: Not on file  . Food insecurity    Worry: Not on file    Inability: Not on file  . Transportation needs    Medical: Not on file    Non-medical: Not on file  Tobacco Use  . Smoking status: Never Smoker  . Smokeless tobacco: Never Used  Substance and Sexual Activity  . Alcohol use: No  . Drug use: No  . Sexual activity: Yes    Birth control/protection: Surgical  Lifestyle  . Physical activity    Days per week: Not on file    Minutes per session: Not on file  . Stress: Not on file  Relationships  . Social Herbalist on phone: Not on file    Gets together: Not on file    Attends religious service: Not on file    Active member of club  or organization: Not on file    Attends meetings of clubs or organizations: Not on file    Relationship status: Not on file  . Intimate partner violence    Fear of current or ex partner: Not on file    Emotionally abused: Not on file    Physically abused: Not on file    Forced sexual activity: Not on file  Other Topics Concern  . Not on file  Social History Narrative  . Not on file    REVIEW OF SYSTEMS: Constitutional: No fevers, chills, or sweats, no generalized fatigue, change in appetite Eyes: No visual changes, double vision, eye pain Ear, nose and throat: No hearing loss, ear pain, nasal congestion, sore throat Cardiovascular: No chest pain, palpitations Respiratory:  No shortness of breath at rest or with exertion, wheezes GastrointestinaI: No nausea, vomiting, diarrhea, abdominal pain, fecal incontinence Genitourinary:  No dysuria, urinary retention or frequency Musculoskeletal:  No neck pain, back pain Integumentary: No rash, pruritus, skin lesions Neurological: as above Psychiatric: No depression, insomnia, anxiety Endocrine: No palpitations, fatigue, diaphoresis, mood swings, change in appetite, change in weight, increased thirst Hematologic/Lymphatic:  No purpura,  petechiae. Allergic/Immunologic: no itchy/runny eyes, nasal congestion, recent allergic reactions, rashes  PHYSICAL EXAM: Blood pressure 113/75, pulse 76, temperature 98 F (36.7 C), temperature source Oral, height '5\' 3"'$  (1.6 m), weight 151 lb (68.5 kg), SpO2 98 %. General: No acute distress.  Patient appears well-groomed.   Head:  Normocephalic/atraumatic Eyes:  fundi examined but not visualized Neck: supple, no paraspinal tenderness, full range of motion Back: No paraspinal tenderness Heart: regular rate and rhythm Lungs: Clear to auscultation bilaterally. Vascular: No carotid bruits. Neurological Exam: Mental status: alert and oriented to person, place, and time, recent and remote memory intact, fund of knowledge intact, attention and concentration intact, speech fluent and not dysarthric, language intact. Cranial nerves: CN I: not tested CN II: pupils equal, round and reactive to light, visual fields intact CN III, IV, VI:  full range of motion, no nystagmus, no ptosis CN V: facial sensation intact CN VII: upper and lower face symmetric CN VIII: hearing intact CN IX, X: gag intact, uvula midline CN XI: sternocleidomastoid and trapezius muscles intact CN XII: tongue midline Bulk & Tone: normal, no fasciculations. Motor:  5/5 throughout  Sensation:  Temperature and vibration sensation intact. Deep Tendon Reflexes:  2+ throughout, toes downgoing.   Finger to nose testing:  Without dysmetria.   Heel to shin:  Without dysmetria.   Gait:  Normal station and stride.  Able to turn. Romberg with sway. Dix Hallpike positive on right.  Head Impulse Test positive   IMPRESSION: 1.  Migraine without aura, without status migrainosus, not intractable 2.  Medication-overuse headache/rebound headache 3.  Suspect benign paroxysmal positional vertigo, right, given description of dizziness and exam findings.  Migraine-related also possible.  PLAN: 1.  For preventative management, topiramate  '25mg'$  at bedtime for 1 week, then '50mg'$  at bedtime.  If not improved in 5 weeks, we can increase dose to '75mg'$  at bedtime 2.  For abortive therapy, sumatriptan '100mg'$  3.  STOP IBUPROFEN, ALEVE AND TYLENOL.  Limit use of pain relievers to no more than 2 days out of week to prevent risk of rebound or medication-overuse headache. 4.  Keep headache diary 5.  Exercise, hydration, caffeine cessation, sleep hygiene, monitor for and avoid triggers 6.  Consider:  magnesium citrate '400mg'$  daily, riboflavin '400mg'$  daily, and coenzyme Q10 '100mg'$  three times daily 7. Always keep in mind  that currently taking a hormone or birth control may be a possible trigger or aggravating factor for migraine. 8. Follow up 4 Months.   Thank you for allowing me to take part in the care of this patient.  Metta Clines, DO  CC: Redmond School, MD

## 2018-10-11 ENCOUNTER — Ambulatory Visit (INDEPENDENT_AMBULATORY_CARE_PROVIDER_SITE_OTHER): Payer: 59 | Admitting: Neurology

## 2018-10-11 ENCOUNTER — Encounter: Payer: Self-pay | Admitting: Neurology

## 2018-10-11 ENCOUNTER — Other Ambulatory Visit: Payer: Self-pay

## 2018-10-11 VITALS — BP 113/75 | HR 76 | Temp 98.0°F | Ht 63.0 in | Wt 151.0 lb

## 2018-10-11 DIAGNOSIS — G43709 Chronic migraine without aura, not intractable, without status migrainosus: Secondary | ICD-10-CM

## 2018-10-11 DIAGNOSIS — T3995XA Adverse effect of unspecified nonopioid analgesic, antipyretic and antirheumatic, initial encounter: Secondary | ICD-10-CM | POA: Diagnosis not present

## 2018-10-11 DIAGNOSIS — H8111 Benign paroxysmal vertigo, right ear: Secondary | ICD-10-CM | POA: Diagnosis not present

## 2018-10-11 DIAGNOSIS — G4440 Drug-induced headache, not elsewhere classified, not intractable: Secondary | ICD-10-CM

## 2018-10-11 MED ORDER — TOPIRAMATE 50 MG PO TABS: 50.0000 mg | ORAL_TABLET | Freq: Every day | ORAL | 3 refills | Status: DC

## 2018-10-11 MED ORDER — SUMATRIPTAN SUCCINATE 100 MG PO TABS: ORAL_TABLET | ORAL | 3 refills | Status: DC

## 2018-10-11 NOTE — Patient Instructions (Signed)
Migraine Recommendations: 1.  Start topiramate 50mg  tablet.  Take 1/2 tablet at bedtime for one week, then increase to 1 tablet at bedtime.  Contact me in 5 weeks if headaches not improved and we can increase dose. 2.  Take sumatriptan 100mg  at earliest onset of headache.  May repeat dose once in 2 hours if needed.  Do not exceed two tablets in 24 hours. 3.  STOP IBUPROFEN, ALEVE AND TYLENOL.  Limit use of pain relievers to no more than 2 days out of the week.  These medications include acetaminophen, ibuprofen, triptans and narcotics.  This will help reduce risk of rebound headaches. 4.  Be aware of common food triggers such as processed sweets, processed foods with nitrites (such as deli meat, hot dogs, sausages), foods with MSG, alcohol (such as wine), chocolate, certain cheeses, certain fruits (dried fruits, bananas, pineapple), vinegar, diet soda. 4.  Avoid caffeine 5.  Routine exercise 6.  Proper sleep hygiene 7.  Stay adequately hydrated with water 8.  Keep a headache diary. 9.  Maintain proper stress management. 10.  Do not skip meals. 11.  Consider supplements:  Magnesium citrate 400mg  to 600mg  daily, riboflavin 400mg , Coenzyme Q 10 100mg  three times daily 12.  Will refer to vestibular rehab 13.  Follow up in 4 months

## 2018-10-13 ENCOUNTER — Ambulatory Visit: Payer: 59 | Admitting: Neurology

## 2018-10-25 ENCOUNTER — Other Ambulatory Visit: Payer: Self-pay | Admitting: Internal Medicine

## 2018-10-25 ENCOUNTER — Other Ambulatory Visit (HOSPITAL_COMMUNITY): Payer: Self-pay | Admitting: Internal Medicine

## 2018-10-25 DIAGNOSIS — R109 Unspecified abdominal pain: Secondary | ICD-10-CM

## 2019-02-20 NOTE — Progress Notes (Deleted)
NEUROLOGY FOLLOW UP OFFICE NOTE  [name] [id number]  HISTORY OF PRESENT ILLNESS: [name] is a 58 year old woman with migraines, fibromyalgia vs questionable lupus and depression who follows up for migraines.  UPDATE: Last visit, she was started on topiramate. Intensity:  *** Duration:  *** Frequency:  *** Frequency of abortive medication: *** Current NSAIDS:   None Current analgesics:  tramadol (2 daily for fibromyalgia) Current triptans:   Sumatriptan 100 mg Current ergotamine:  none Current anti-emetic:  none Current muscle relaxants:  none Current anti-anxiolytic:  clonazepam Current sleep aide:  none Current Antihypertensive medications:  none Current Antidepressant medications:  Lexapro 20mg  Current Anticonvulsant medications:   Topiramate 50 mg at bedtime Current anti-CGRP:  none Current Vitamins/Herbal/Supplements:  D, E Current Antihistamines/Decongestants:  none Other therapy:  none Hormone/birth control:  estradiol  Caffeine:  6 oz coffee daily Diet:  Drinks at least 48-64 oz water daily.  No soda Exercise:  Not as much as she should.   Depression:  yes; Anxiety:  yes Other pain:  Chronic pain Sleep hygiene:  varies  HISTORY: Onset: 58 years old.  They have been worse over past few minutes.  She has been having a constant pressure headache on top Location:  Usually left sided and behind left eye.   Quality:  pounding Initial intensity:  Severe.  She denies severe or thunderclap headache Aura:  No Premonitory Phase:  Head pressure Postdrome:  Hangover effect for 2 to 3 days Associated symptoms: Nausea photophobia, phonophobia, blurred vision (more recent).  Sometimes vomiting.  She denies associated autonomic symptoms and unilateral numbness or weakness. Initial duration:  2 days Initial Frequency:  3 a month Treats with ibuprofen or tylenol.  Occasional Aleve Initial Frequency of abortive medication: at least once a day Triggers: None  Leaving factors: Dark and quiet room Activity:  Aggravates.  She also has history of dizziness off and on, worse over past month.  She describes it as a spinning sensation.  Spontaneous onset.  It typically lasts several days.  They occur once a month.  Movement aggravates it.  Some nausea.  Worse getting up in morning.  When walking, she feels off-balance.  Past NSAIDS:   Ibuprofen, Aleve Past analgesics:  Excedrin Migraine, Tylenol Past abortive triptans:  Maxalt Past abortive ergotamine:  none Past muscle relaxants:  none Past anti-emetic:  Phenergan, Zofran (effective) Past antihypertensive medications:  none Past antidepressant medications:  none Past anticonvulsant medications:  none Past anti-CGRP:  none Past vitamins/Herbal/Supplements:  none Past antihistamines/decongestants:  none Other past therapies:  none   Family history of headache:  Sister (migraines), brother (migraines)  MRI of brain without contrast from 07/19/18 was personally reviewed and showed very mild nonspecific subcortical and periventricular punctate T2 and FLAIR hyperintense foci.  PAST MEDICAL HISTORY: Past Medical History:  Diagnosis Date  . Anemia    2002 after having hysterectomy  . Anxiety   . Chronic pain   . Depression   . Fibromyalgia   . GERD (gastroesophageal reflux disease)   . History of kidney stones   . Lupus ([place])   . Migraine headache   . PONV (postoperative nausea and vomiting)   . Sleep apnea    "Not enough for a CPAP".    MEDICATIONS: Current Outpatient Medications on File Prior to Visit  Medication Sig Dispense Refill  . Cholecalciferol (VITAMIN D PO) Take 1 tablet by mouth daily.    . clonazePAM (KLONOPIN) 1 MG tablet Take 1 mg  by mouth at bedtime.    Marland Kitchen escitalopram (LEXAPRO) 20 MG tablet Take 20 mg by mouth daily.    Marland Kitchen estradiol (ESTRACE) 1 MG tablet Take 1 mg by mouth daily.    Nyoka Cowden Tea, Camellia sinensis, (GREEN TEA PO) Take 1 tablet by mouth daily.    .  MethylPREDNISolone Acetate (DEPO-MEDROL, PF, IJ) Inject as directed every 3 (three) months. Reported on 06/07/2015    . naproxen sodium (ALEVE) 220 MG tablet Take 220 mg by mouth 2 (two) times daily as needed (Pain). Reported on 06/07/2015    . oxycodone-acetaminophen (LYNOX) 10-300 MG per tablet Take 1 tablet by mouth every 4 (four) hours as needed for pain. Reported on 06/07/2015    . SUMAtriptan (IMITREX) 100 MG tablet Take 1 tablet earliest onset of migraine.  May repeat once after 2 hours PRN.  Maximum 2 tablets in 24 hrs 10 tablet 3  . topiramate (TOPAMAX) 50 MG tablet Take 1 tablet (50 mg total) by mouth at bedtime. 30 tablet 3  . traMADol (ULTRAM) 50 MG tablet Take 50 mg by mouth every 6 (six) hours as needed for pain.    Marland Kitchen VITAMIN E PO Take 1 tablet by mouth daily.     No current facility-administered medications on file prior to visit.     ALLERGIES: Allergies  Allergen Reactions  . Penicillins Swelling and Other (See Comments)    Blisters     FAMILY HISTORY: Family History  Problem Relation Age of Onset  . Hypertension Mother   . Diabetes Father   . Hypertension Sister        accidental death-fall, head injury  . Cancer Brother        lung  . Hypertension Brother   . Hypertension Sister   . Hypertension Brother   . Diabetes Brother   . Hypertension Sister   . Hypertension Brother   . Other Son        suicide   SOCIAL HISTORY: Social History   Socioeconomic History  . Marital status: Married    Spouse name: [name]  . Number of children: 2  . Years of education: Not on file  . Highest education level: 12th grade  Occupational History  . Not on file  Social Needs  . Financial resource strain: Not on file  . Food insecurity    Worry: Not on file    Inability: Not on file  . Transportation needs    Medical: Not on file    Non-medical: Not on file  Tobacco Use  . Smoking status: Never Smoker  . Smokeless tobacco: Never Used  Substance and Sexual Activity  .  Alcohol use: No  . Drug use: No  . Sexual activity: Yes    Birth control/protection: Surgical  Lifestyle  . Physical activity    Days per week: Not on file    Minutes per session: Not on file  . Stress: Not on file  Relationships  . Social Herbalist on phone: Not on file    Gets together: Not on file    Attends religious service: Not on file    Active member of club or organization: Not on file    Attends meetings of clubs or organizations: Not on file    Relationship status: Not on file  . Intimate partner violence    Fear of current or ex partner: Not on file    Emotionally abused: Not on file    Physically abused: Not  on file    Forced sexual activity: Not on file  Other Topics Concern  . Not on file  Social History Narrative   Patient is right-handed. She lives with her husband in a one level home.. she drinks 6 oz of coffee a day. She walks 3 x a week for exercise.    REVIEW OF SYSTEMS: Constitutional: No fevers, chills, or sweats, no generalized fatigue, change in appetite Eyes: No visual changes, double vision, eye pain Ear, nose and throat: No hearing loss, ear pain, nasal congestion, sore throat Cardiovascular: No chest pain, palpitations Respiratory:  No shortness of breath at rest or with exertion, wheezes GastrointestinaI: No nausea, vomiting, diarrhea, abdominal pain, fecal incontinence Genitourinary:  No dysuria, urinary retention or frequency Musculoskeletal:  No neck pain, back pain Integumentary: No rash, pruritus, skin lesions Neurological: as above Psychiatric: No depression, insomnia, anxiety Endocrine: No palpitations, fatigue, diaphoresis, mood swings, change in appetite, change in weight, increased thirst Hematologic/Lymphatic:  No purpura, petechiae. Allergic/Immunologic: no itchy/runny eyes, nasal congestion, recent allergic reactions, rashes  PHYSICAL EXAM: *** General: No acute distress.  Patient appears well-groomed.   Head:   Normocephalic/atraumatic Eyes:  Fundi examined but not visualized Neck: supple, no paraspinal tenderness, full range of motion Heart:  Regular rate and rhythm Lungs:  Clear to auscultation bilaterally Back: No paraspinal tenderness Neurological Exam: alert and oriented to person, place, and time. Attention span and concentration intact, recent and remote memory intact, fund of knowledge intact.  Speech fluent and not dysarthric, language intact.  CN II-XII intact. Bulk and tone normal, muscle strength 5/5 throughout.  Sensation to light touch, temperature and vibration intact.  Deep tendon reflexes 2+ throughout, toes downgoing.  Finger to nose and heel to shin testing intact.  Gait normal, Romberg negative.  IMPRESSION: 1.  Migraine without aura, without status migrainosus, not intractable  PLAN: 1.  For preventative management, *** 2.  For abortive therapy, *** 3.  Limit use of pain relievers to no more than 2 days out of week to prevent risk of rebound or medication-overuse headache. 4.  Keep headache diary 5.  Exercise, hydration, caffeine cessation, sleep hygiene, monitor for and avoid triggers 6.  Consider:  magnesium citrate 400mg  daily, riboflavin 400mg  daily, and coenzyme Q10 100mg  three times daily 7. Always keep in mind that currently taking a hormone or birth control may be a possible trigger or aggravating factor for migraine. 8. Follow up ***  Metta Clines, DO  CC: Redmond School, MD

## 2019-02-21 ENCOUNTER — Ambulatory Visit: Payer: 59 | Admitting: Neurology

## 2019-03-15 ENCOUNTER — Other Ambulatory Visit: Payer: Self-pay

## 2019-03-15 DIAGNOSIS — Z20822 Contact with and (suspected) exposure to covid-19: Secondary | ICD-10-CM

## 2019-03-18 LAB — NOVEL CORONAVIRUS, NAA: SARS-CoV-2, NAA: DETECTED — AB

## 2019-03-30 ENCOUNTER — Other Ambulatory Visit (HOSPITAL_COMMUNITY): Payer: Self-pay | Admitting: Internal Medicine

## 2019-03-30 DIAGNOSIS — R059 Cough, unspecified: Secondary | ICD-10-CM

## 2019-03-30 DIAGNOSIS — R05 Cough: Secondary | ICD-10-CM

## 2019-03-31 ENCOUNTER — Ambulatory Visit (HOSPITAL_COMMUNITY)
Admission: RE | Admit: 2019-03-31 | Discharge: 2019-03-31 | Disposition: A | Discharge status: Home / Self Care | Payer: 59 | Source: Ambulatory Visit | Attending: Internal Medicine | Admitting: Internal Medicine

## 2019-03-31 ENCOUNTER — Other Ambulatory Visit: Payer: Self-pay

## 2019-03-31 DIAGNOSIS — R05 Cough: Secondary | ICD-10-CM | POA: Diagnosis present

## 2019-03-31 DIAGNOSIS — R059 Cough, unspecified: Secondary | ICD-10-CM

## 2019-05-31 ENCOUNTER — Encounter: Payer: Self-pay | Admitting: Neurology

## 2019-06-01 NOTE — Progress Notes (Signed)
Virtual Visit via Video Note The purpose of this virtual visit is to provide medical care while limiting exposure to the novel coronavirus.    Consent was obtained for video visit:  Yes Answered questions that patient had about telehealth interaction:  Yes I discussed the limitations, risks, security and privacy concerns of performing an evaluation and management service by telemedicine. I also discussed with the patient that there may be a patient responsible charge related to this service. The patient expressed understanding and agreed to proceed.  Pt location: Home Physician Location: office Name of referring provider:  Redmond School, MD I connected with [name] at patients initiation/request on 06/05/2019 at  8:50 AM EST by video enabled telemedicine application and verified that I am speaking with the correct person using two identifiers. Pt MRN:  [id number] Pt DOB:  [birth year] Video Participants:  [name]   History of Present Illness:  [name] is a 59 year old woman with migraines, fibromyalgia vs questionable lupus and depression who follows up for migraines.  UPDATE: Started on topiramate in June.  Stopped topiramate due to side effects.  She stopped sumatriptan because it made her sick even though it works.  Now she just lays down in dark.  Intensity:  severe Duration:  3 hours Frequency:  2 in past 30 days.  Usually 4 to 6 a month.   Frequency of abortive medication: nothing.   Current NSAIDS:  none Current analgesics:  Tylenol, tramadol (2 daily for fibromyalgia) Current triptans:  none Current ergotamine:  none Current anti-emetic:  none Current muscle relaxants:  none Current anti-anxiolytic:  clonazepam Current sleep aide:  none Current Antihypertensive medications:  none Current Antidepressant medications:  Lexapro 20mg  Current Anticonvulsant medications: none Current anti-CGRP:  none Current Vitamins/Herbal/Supplements:  D, E Current  Antihistamines/Decongestants:  none Other therapy:  none Hormone/birth control:  estradiol  Caffeine:  6 oz coffee daily Diet:  Drinks at least 48-64 oz water daily.  No soda Exercise:  Not as much as she should.   Depression:  yes; Anxiety:  yes Other pain:  Chronic pain Sleep hygiene:  varies  HISTORY: Onset:  59 years old.  They have been worse over past few minutes.  She has been having a constant pressure headache on top Location:  Usually left sided and behind left eye.   Quality:  pounding Initial intensity:  Severe.  She denies severe or thunderclap headache Aura:  No Premonitory Phase:  Head pressure Postdrome:  Hangover effect for 2 to 3 days Associated symptoms:  Nausea, photophobia, phonophobia, blurred vision (more recent).  Sometimes vomiting.  She denies associated autonomic symptoms and unilateral numbness or weakness. Initial duration:  2 days Initial frequency:  3 a month Initial frequency of abortive medication: ibuprofen or naproxen at least once a day Triggers:  No Relieving factors:  Dark and quiet room Activity:  Aggravates.  She also has history of dizziness off and on, worse over past month.  She describes it as a spinning sensation.  Spontaneous onset.  It typically lasts several days.  They occur once a month.  Movement aggravates it.  Some nausea.  Worse getting up in morning.  When walking, she feels off-balance.   Past NSAIDS:  ibuprofen; naproxen Past analgesics:  Excedrin Migraine Past abortive triptans:  Maxalt; sumatriptan 100mg  Past abortive ergotamine:  none Past muscle relaxants:  none Past anti-emetic:  Phenergan, Zofran (effective) Past antihypertensive medications:  none Past antidepressant medications:  none Past anticonvulsant medications:  topiramate 50mg  at bedtime (side effects) Past anti-CGRP:  none Past vitamins/Herbal/Supplements:  none Past antihistamines/decongestants:  none Other past therapies:  none  Family history of  headache:  Sister (migraines), brother (migraines)  MRI of brain without contrast from 07/19/18 was personally reviewed and showed very mild nonspecific subcortical and periventricular punctate T2 and FLAIR hyperintense foci.   Past Medical History: Past Medical History:  Diagnosis Date  . Anemia    2002 after having hysterectomy  . Anxiety   . Chronic pain   . Depression   . Fibromyalgia   . GERD (gastroesophageal reflux disease)   . History of kidney stones   . Lupus ([place])   . Migraine headache   . PONV (postoperative nausea and vomiting)   . Sleep apnea    "Not enough for a CPAP".    Medications: Outpatient Encounter Medications as of 06/05/2019  Medication Sig  . Cholecalciferol (VITAMIN D PO) Take 1 tablet by mouth daily.  . clonazePAM (KLONOPIN) 1 MG tablet Take 1 mg by mouth at bedtime.  Marland Kitchen escitalopram (LEXAPRO) 20 MG tablet Take 20 mg by mouth daily.  Marland Kitchen estradiol (ESTRACE) 1 MG tablet Take 1 mg by mouth daily.  Nyoka Cowden Tea, Camellia sinensis, (GREEN TEA PO) Take 1 tablet by mouth daily.  . MethylPREDNISolone Acetate (DEPO-MEDROL, PF, IJ) Inject as directed every 3 (three) months. Reported on 06/07/2015  . naproxen sodium (ALEVE) 220 MG tablet Take 220 mg by mouth 2 (two) times daily as needed (Pain). Reported on 06/07/2015  . oxycodone-acetaminophen (LYNOX) 10-300 MG per tablet Take 1 tablet by mouth every 4 (four) hours as needed for pain. Reported on 06/07/2015  . traMADol (ULTRAM) 50 MG tablet Take 50 mg by mouth every 6 (six) hours as needed for pain.  Marland Kitchen VITAMIN E PO Take 1 tablet by mouth daily.  . SUMAtriptan (IMITREX) 100 MG tablet Take 1 tablet earliest onset of migraine.  May repeat once after 2 hours PRN.  Maximum 2 tablets in 24 hrs (Patient not taking: Reported on 05/31/2019)  . topiramate (TOPAMAX) 50 MG tablet Take 1 tablet (50 mg total) by mouth at bedtime. (Patient not taking: Reported on 05/31/2019)   No facility-administered encounter medications on file as  of 06/05/2019.    Allergies: Allergies  Allergen Reactions  . Penicillins Swelling and Other (See Comments)    Blisters     Family History: Family History  Problem Relation Age of Onset  . Hypertension Mother   . Diabetes Father   . Hypertension Sister        accidental death-fall, head injury  . Cancer Brother        lung  . Hypertension Brother   . Hypertension Sister   . Hypertension Brother   . Diabetes Brother   . Hypertension Sister   . Hypertension Brother   . Other Son        suicide    Social History: Social History   Socioeconomic History  . Marital status: Married    Spouse name: Alvester Chou  . Number of children: 2  . Years of education: Not on file  . Highest education level: 12th grade  Occupational History  . Not on file  Tobacco Use  . Smoking status: Never Smoker  . Smokeless tobacco: Never Used  Substance and Sexual Activity  . Alcohol use: No  . Drug use: No  . Sexual activity: Yes    Birth control/protection: Surgical  Other Topics Concern  . Not on file  Social History Narrative   Patient is right-handed. She lives with her husband in a one level home.. she drinks 6 oz of coffee a day. She walks 3 x a week for exercise.   Social Determinants of Health   Financial Resource Strain:   . Difficulty of Paying Living Expenses: Not on file  Food Insecurity:   . Worried About Charity fundraiser in the Last Year: Not on file  . Ran Out of Food in the Last Year: Not on file  Transportation Needs:   . Lack of Transportation (Medical): Not on file  . Lack of Transportation (Non-Medical): Not on file  Physical Activity:   . Days of Exercise per Week: Not on file  . Minutes of Exercise per Session: Not on file  Stress:   . Feeling of Stress : Not on file  Social Connections:   . Frequency of Communication with Friends and Family: Not on file  . Frequency of Social Gatherings with Friends and Family: Not on file  . Attends Religious Services: Not  on file  . Active Member of Clubs or Organizations: Not on file  . Attends Archivist Meetings: Not on file  . Marital Status: Not on file  Intimate Partner Violence:   . Fear of Current or Ex-Partner: Not on file  . Emotionally Abused: Not on file  . Physically Abused: Not on file  . Sexually Abused: Not on file    Observations/Objective:   Height 5\' 3"  (1.6 m), weight 147 lb (66.7 kg). No acute distress.  Alert and oriented.  Speech fluent and not dysarthric.  Language intact.  Eyes orthophoric on primary gaze.  Face symmetric.  Assessment and Plan:   Migraine without aura, without status migrainosus, not intractable  1.  For preventative management, we will start propranolol ER 60mg  daily.   Even though her migraines are currently well-controlled, they typically flare up in 3 months. 2.  For abortive therapy, we will try Nurtec 75mg .  3.  Limit use of pain relievers to no more than 2 days out of week to prevent risk of rebound or medication-overuse headache. 4.  Keep headache diary 5.  Exercise, hydration, caffeine cessation, sleep hygiene, monitor for and avoid triggers 6. Follow up 4 months.   Follow Up Instructions:    -I discussed the assessment and treatment plan with the patient. The patient was provided an opportunity to ask questions and all were answered. The patient agreed with the plan and demonstrated an understanding of the instructions.   The patient was advised to call back or seek an in-person evaluation if the symptoms worsen or if the condition fails to improve as anticipated.   Dudley Major, DO

## 2019-06-05 ENCOUNTER — Other Ambulatory Visit: Payer: Self-pay

## 2019-06-05 ENCOUNTER — Encounter: Payer: Self-pay | Admitting: Neurology

## 2019-06-05 ENCOUNTER — Telehealth (INDEPENDENT_AMBULATORY_CARE_PROVIDER_SITE_OTHER): Payer: 59 | Admitting: Neurology

## 2019-06-05 VITALS — Ht 63.0 in | Wt 147.0 lb

## 2019-06-05 DIAGNOSIS — G43009 Migraine without aura, not intractable, without status migrainosus: Secondary | ICD-10-CM | POA: Diagnosis not present

## 2019-06-05 MED ORDER — NURTEC 75 MG PO TBDP: 1.0000 | ORAL_TABLET | Freq: Every day | ORAL | 11 refills | Status: DC | PRN

## 2019-06-05 MED ORDER — PROPRANOLOL HCL ER 60 MG PO CP24: 60.0000 mg | ORAL_CAPSULE | Freq: Every day | ORAL | 5 refills | Status: DC

## 2019-06-07 ENCOUNTER — Encounter: Payer: Self-pay | Admitting: *Deleted

## 2019-06-07 NOTE — Progress Notes (Addendum)
[name] (Key: [id number]) Rx #[id number] Nurtec 75MG  dispersible tablets   Form [facility] Electronic Prior Authorization Form (2017 NCPDP) Created 3 days ago Sent to Plan 15 hours ago Plan Response 15 hours ago Submit Clinical Questions 15 hours ago Determination Unfavorable 14 hours ago Message from Plan Request Reference Number: [id number]. NURTEC TAB 75MG  ODT is denied for not meeting the prior authorization requirement(s). Details of this decision are in the notice attached below or have been faxed to you. Appeals are not supported through [facility]. Please refer to the fax case notice for appeals information and instructions.  Asked Dr. Tomi Likens if he wants to appeal Will send denial fax to scan into her chart

## 2019-06-08 ENCOUNTER — Other Ambulatory Visit: Payer: Self-pay | Admitting: Neurology

## 2019-06-08 ENCOUNTER — Telehealth: Payer: Self-pay

## 2019-06-08 MED ORDER — UBRELVY 100 MG PO TABS: 1.0000 | ORAL_TABLET | ORAL | 11 refills | Status: DC | PRN

## 2019-06-08 NOTE — Progress Notes (Signed)
[facility] letter 06/07/2019 Metta Clines [address] [place], [place] [address] Plan member ID: [id number] Case number: [id number] Prescriber name: Metta Clines Prescriber fax: [phone] Bal[place] Dear Nickolas Madrid, On behalf of [employer], [facility] is responsible for reviewing pharmacy services provided to [facility] members. We received a request from your prescriber for coverage of Nurtec Tab 75mg  Odt. We reviewed all of the information you and/or your doctor sent to [place] and sent the information to an appropiate physician specialist if needed. Unfortunately, we must deny coverage for Nurtec. Why was my request denied? This request was denied because you did not meet the following clinical requirements: The requested medication and/or diagnosis are not a covered benefit and excluded from coverage in accordance with the terms and conditions of your plan benefit. Therefore, the request has been administratively denied. The requested medication and/or diagnosis are not a covered benefit and are excluded from coverage in accordance with the terms and conditions of your plan benefit. Therefore, this request has been administratively denied. The reason(s) [facility] did not approve this medication can be found above. This denial is based on our Nurtec drug coverage policy, in addition to any supplementary information you or your prescriber may have submitted. How can I obtain the material(s) used to review this request? You may request, free of charge, a copy of the drug coverage policy, actual benefit provision, guideline, protocol or other information that factored into the decision, including the diagnosis code and the treatment code and their corresponding meanings, by calling us at [phone], or by writing to the address below: [facility] c/[facility] Prior [place]ization [place] [address] Please note that this decision only affects  whether your prescription plan will pay for this medication. Only you and your prescriber can decide what is best for you and your treatment. You may still buy this medication (at full cost) at your local pharmacy.

## 2019-06-08 NOTE — Telephone Encounter (Signed)
  Ms. [place] insurance would not approve Nurtec.  Instead, Dr. Tomi Likens prescribed a different rescue medication called Roselyn Meier. Take 1 tablet earliest onset of migraine. May repeat once in 2 hours if needed Pt verbalized understanding

## 2019-06-08 NOTE — Telephone Encounter (Signed)
-----   Message from Pieter Partridge, DO sent at 06/08/2019 11:31 AM EST ----- Ms. Birkey's insurance would not approve Nurtec. Instead, I have prescribed a different rescue medication called Ubrelvy.  Take 1 tablet earliest onset of migraine.  May repeat once in 2 hours if needed.

## 2019-06-12 ENCOUNTER — Encounter: Payer: Self-pay | Admitting: Neurology

## 2019-06-12 NOTE — Progress Notes (Addendum)
[name] ([name]) Rx #: [id number] [name] 100MG  tablets   Form [facility] Electronic Prior Authorization Form (2017 NCPDP)  Patient Copay Assistance  Created 4 days ago Sent to Plan 8 minutes ago Plan Response 7 minutes ago Submit Clinical Questions 1 minute ago Determination Wait for Determination Please wait for [facility] 2017 NCPDP to return a determination.  06/26/19-called [facility] and they said medication was approved for 8 tablets per month until 06/11/20.

## 2019-06-26 ENCOUNTER — Telehealth: Payer: Self-pay | Admitting: Neurology

## 2019-06-26 ENCOUNTER — Other Ambulatory Visit: Payer: Self-pay

## 2019-06-26 MED ORDER — UBRELVY 100 MG PO TABS: 1.0000 | ORAL_TABLET | ORAL | 11 refills | Status: DC | PRN

## 2019-06-26 NOTE — Telephone Encounter (Signed)
Spoke with patient and [facility]- patient has been approved only for 8 tablets a month for the Ubrelvy 100mg  until 06/11/20. Patient is needing a new prescription sent to pharm to represent the approval dosage. Please send this to the pharm on file so patient can pick up medication. 10 tablets per 5 days has been denied by insurance. They are only able to approve the 8 per 30 days. Thanks!

## 2019-06-26 NOTE — Telephone Encounter (Signed)
New RX sent to pharm on file

## 2019-07-07 NOTE — Progress Notes (Signed)
Toney Harner (Key: [id number]) Rx #: [id number] Roselyn Meier 100MG  tablets   Form [facility] Electronic Prior Authorization Form (2017 NCPDP)  Patient Copay Assistance  Created 4 days ago Sent to Plan Determination Favorable 4 hours ago

## 2019-07-28 ENCOUNTER — Other Ambulatory Visit (HOSPITAL_COMMUNITY): Payer: Self-pay | Admitting: Internal Medicine

## 2019-07-28 ENCOUNTER — Ambulatory Visit (HOSPITAL_COMMUNITY)
Admission: RE | Admit: 2019-07-28 | Discharge: 2019-07-28 | Disposition: A | Discharge status: Home / Self Care | Payer: 59 | Source: Ambulatory Visit | Attending: Internal Medicine | Admitting: Internal Medicine

## 2019-07-28 ENCOUNTER — Encounter (HOSPITAL_COMMUNITY): Payer: Self-pay

## 2019-07-28 ENCOUNTER — Other Ambulatory Visit: Payer: Self-pay

## 2019-07-28 DIAGNOSIS — R0600 Dyspnea, unspecified: Secondary | ICD-10-CM

## 2019-08-15 ENCOUNTER — Other Ambulatory Visit: Payer: Self-pay | Admitting: Internal Medicine

## 2019-08-15 DIAGNOSIS — Z1231 Encounter for screening mammogram for malignant neoplasm of breast: Secondary | ICD-10-CM

## 2019-09-11 IMAGING — MR MRI HEAD WITHOUT CONTRAST
7 of 11 series · 31 of 48 positions shown · non-contrast
Comparison: None.

CLINICAL DATA: Headache behind LEFT eye 2 weeks.  No known injury.

EXAM:
MRI HEAD WITHOUT CONTRAST
TECHNIQUE: Multiplanar, multiecho pulse sequences of the brain and surrounding
structures were obtained without intravenous contrast.

[Series 2: DWI · axial · 3.0mm · 0.75mm/px · z∈[-93,+68]mm · 7 of 56 slices shown (1 of 4)]
[im 1/56]
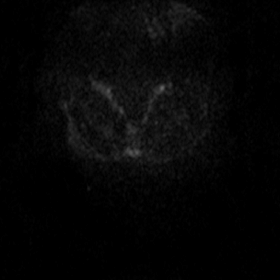
[im 10/56]
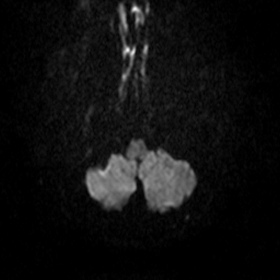
[im 19/56]
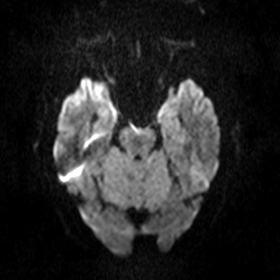
[im 28/56]
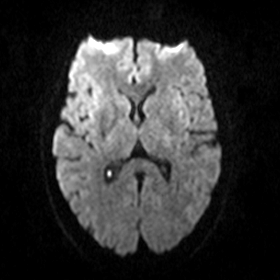
[im 37/56]
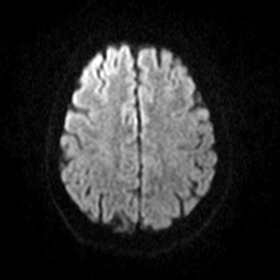
[im 46/56]
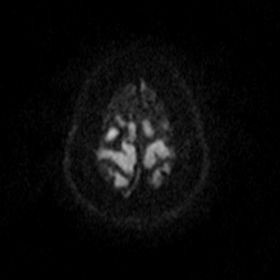
[im 56/56]
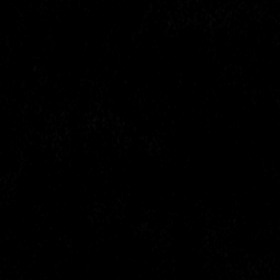

[Series 3: DWI · axial · 3.0mm · 0.77mm/px · z∈[-94,+58]mm · 6 of 51 slices shown (2 of 4)]
[im 1/51]
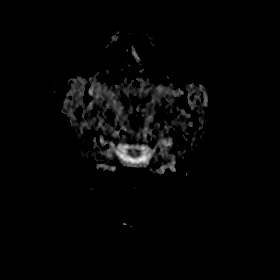
[im 11/51]
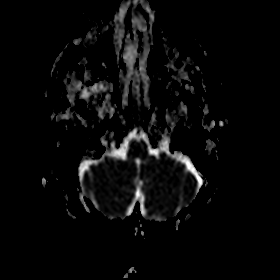
[im 21/51]
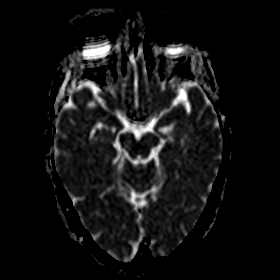
[im 31/51]
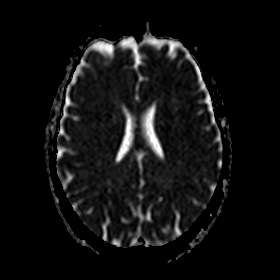
[im 41/51]
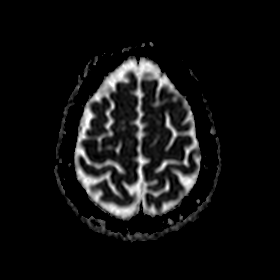
[im 51/51]
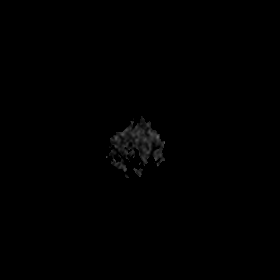

[Series 4: DWI · coronal · 5.0mm · 0.44mm/px · 4 of 40 slices shown (3 of 4)]
[im 1/40]
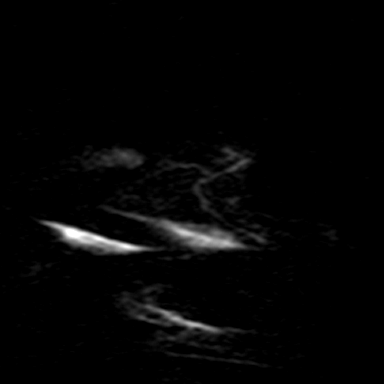
[im 14/40]
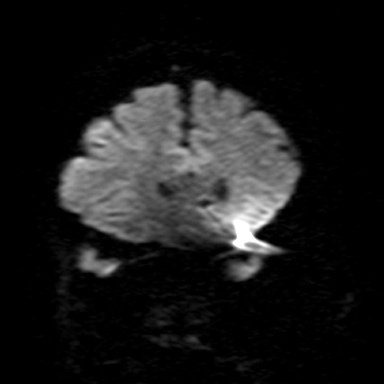
[im 27/40]
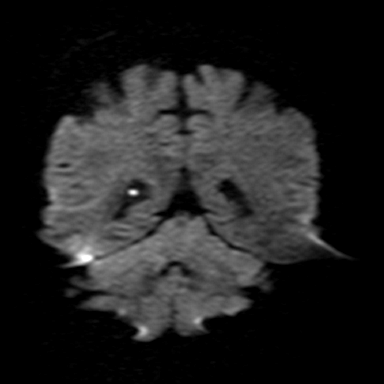
[im 40/40]
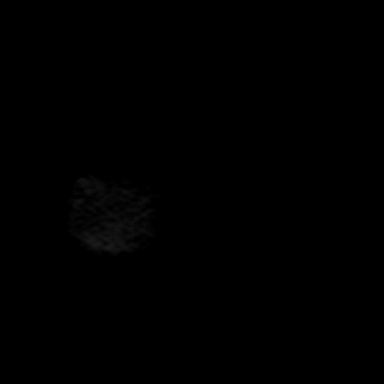

[Series 5: DWI · coronal · 5.0mm · 0.51mm/px · 4 of 37 slices shown (4 of 4)]
[im 1/37]
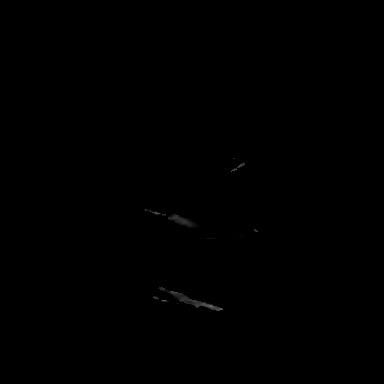
[im 13/37]
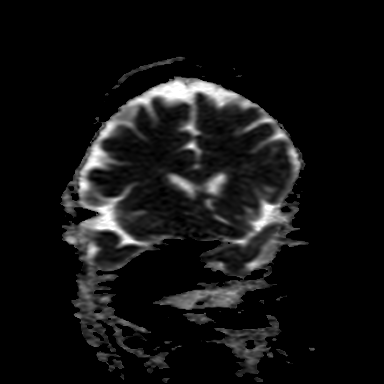
[im 25/37]
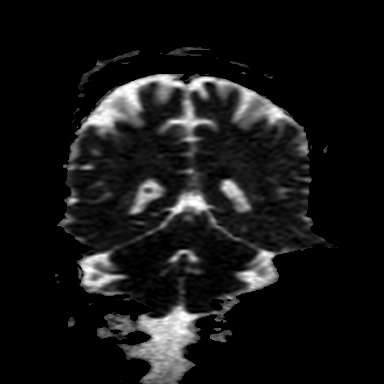
[im 37/37]
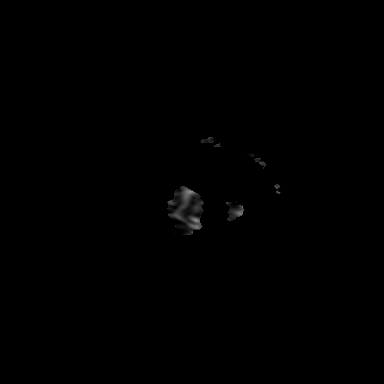

[Series 7: T2 · axial · 5.0mm · 0.48mm/px · z∈[-77,+65]mm · 3 of 23 slices shown (1 of 2)]
[im 1/23]
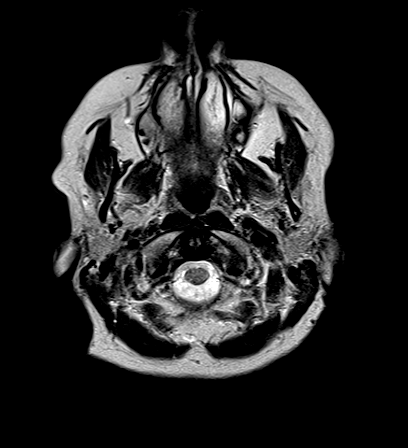
[im 12/23]
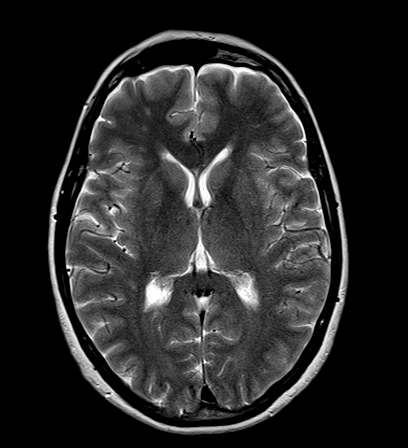
[im 23/23]
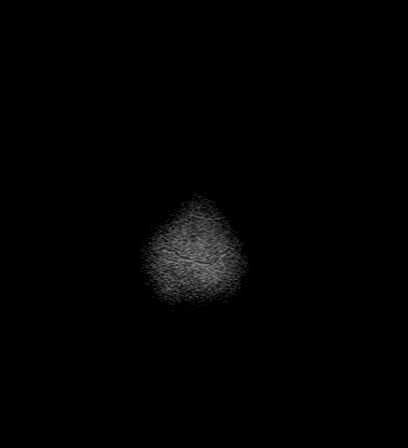

[Series 8: FLAIR · axial · 3.0mm · 0.33mm/px · z∈[-75,+63]mm · 5 of 47 slices shown]
[im 1/47]
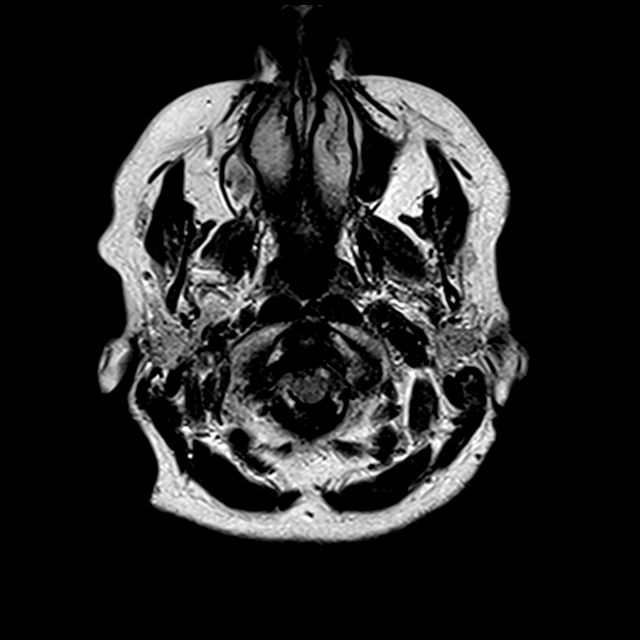
[im 12/47]
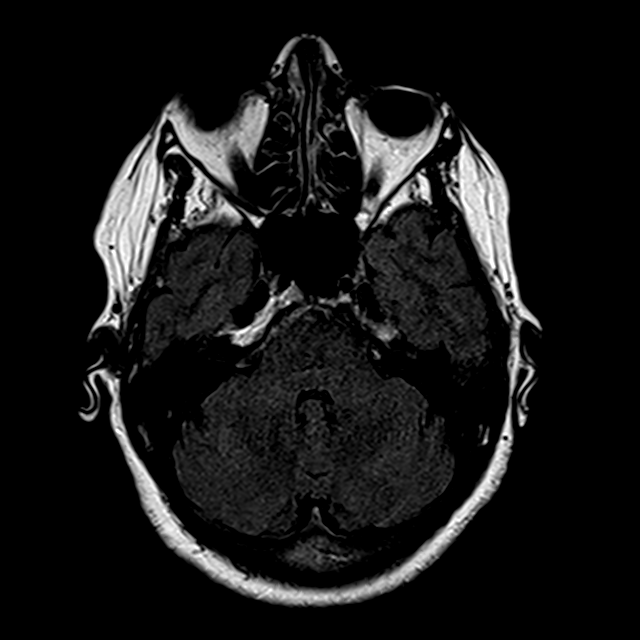
[im 24/47]
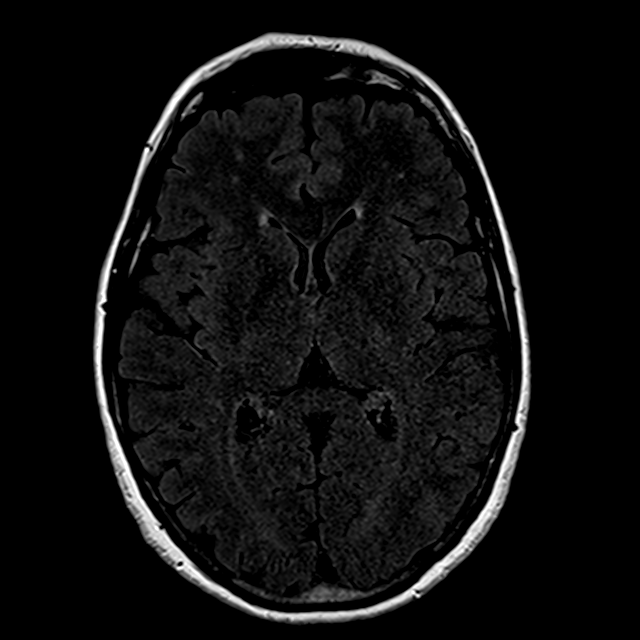
[im 35/47]
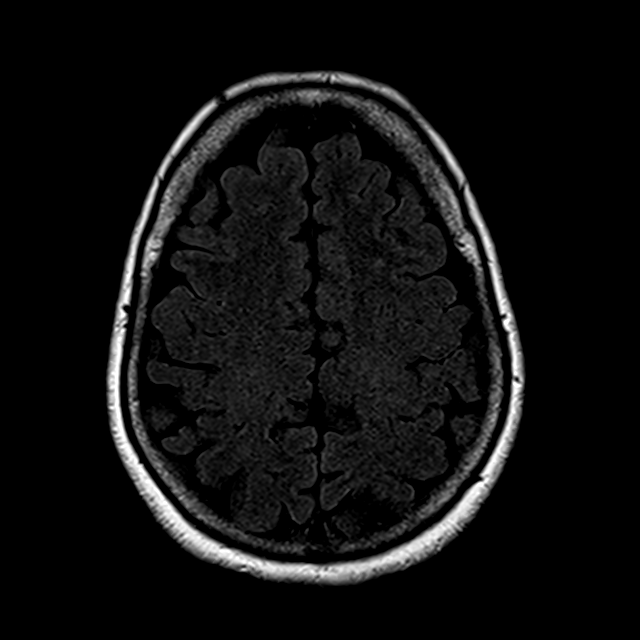
[im 47/47]
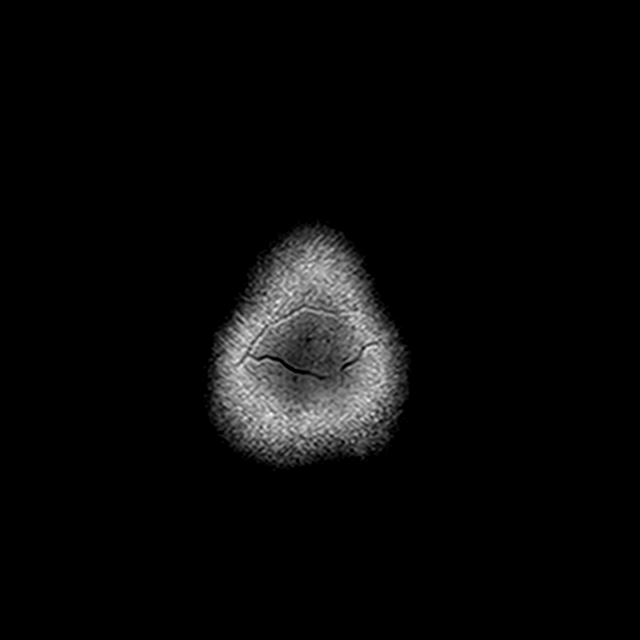

[Series 11: T2 · coronal · 5.0mm · 0.43mm/px · 2 of 28 slices shown (2 of 2)]
[im 1/28]
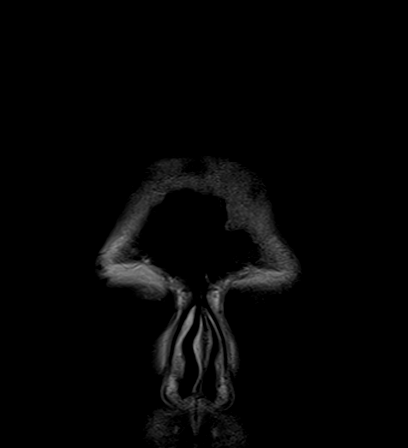
[im 14/28]
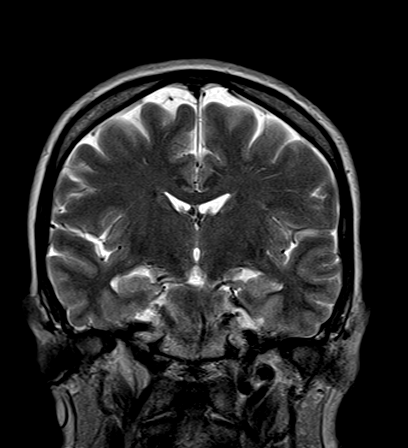

[31 of 48 positions shown; findings below may reference images not displayed]

FINDINGS: Brain: No evidence for acute infarction, hemorrhage, mass lesion,
hydrocephalus, or extra-axial fluid. Normal for age cerebral volume.
Mild subcortical and periventricular T2 and FLAIR hyperintensities,
nonspecific. No periventricular lesions to suggest demyelinating
disease. Small vessel disease, vasculitis, chronic infection, or
complicated migraine could all have this appearance.

Vascular: Normal flow voids.

Skull and upper cervical spine: Normal marrow signal.

Sinuses/Orbits: Negative.

Other: None.
IMPRESSION: Minor white matter signal abnormality, nonspecific. Considerations
include complicated migraine, vasculitis, chronic infection, or
chronic microvascular ischemic change.

No acute intracranial findings.

No intracranial mass lesion or extracranial inflammatory process is
observed.

## 2019-10-03 NOTE — Progress Notes (Unsigned)
NEUROLOGY FOLLOW UP OFFICE NOTE  [name] [id number]  HISTORY OF PRESENT ILLNESS: [name] is a 59 year old woman with migraines, fibromyalgiavs questionablelupus and depression who follows up for migraines.  UPDATE: Started propranolol in February.  Insurance denied Nurtec, so she was started on [place].  *** Intensity:  severe Duration:  3 hours Frequency:  2 in past 30 days.  Usually 4 to 6 a month.   Frequency of abortive medication: nothing.   Current NSAIDS:none Current analgesics:Tylenol, tramadol (2 daily for fibromyalgia) Current triptans:none Current ergotamine:none Current anti-emetic:none Current muscle relaxants:none Current anti-anxiolytic:clonazepam Current sleep aide:none Current Antihypertensive medications:propranolol ER 60mg  Current Antidepressant medications:Lexapro 20mg  Current Anticonvulsant medications:none Current anti-CGRP:Ubrelvy 100mg  Current Vitamins/Herbal/Supplements:D, E Current Antihistamines/Decongestants:none Other therapy:none Hormone/birth control:estradiol  Caffeine:6 oz coffee daily Diet:Drinks at least 48-64 oz water daily. No soda Exercise:Not as much as she should. Depression:yes; Anxiety:yes Other pain:Chronic pain Sleep hygiene:varies  HISTORY: Onset:59 years old. They have been worse over past few minutes. She has been having a constant pressure headache on top Location:Usually left sided and behind left eye. Quality:pounding Initial intensity:Severe. Shedenies severe or thunderclap headache Aura:No Premonitory Phase:Head pressure Postdrome:Hangover effect for 2 to 3 days Associated symptoms:Nausea, photophobia, phonophobia, blurred vision (more recent). Sometimes vomiting.Shedenies associated autonomic symptoms andunilateral numbness or weakness. Initial duration:2 days Initial frequency:3 a month Initial frequency of  abortive medication:ibuprofen or naproxen at least once a day Triggers:No Relieving factors:Dark and quiet room Activity:Aggravates.  She also has history of intermittent dizziness.. She describes it as a spinning sensation. Spontaneous onset. It typically lasts several days. They occur once a month. Movement aggravates it. Some nausea. Worse getting up in morning. When walking, she feels off-balance.   Past NSAIDS:ibuprofen; naproxen Past analgesics:Excedrin Migraine Past abortive triptans:Maxalt; sumatriptan 100mg  Past abortive ergotamine:none Past muscle relaxants:none Past anti-emetic:Phenergan, Zofran (effective) Past antihypertensive medications:none Past antidepressant medications:none Past anticonvulsant medications:topiramate 50mg  at bedtime (side effects) Past anti-CGRP:none Past vitamins/Herbal/Supplements:none Past antihistamines/decongestants:none Other past therapies:none  Family history of headache:Sister (migraines), brother (migraines)  MRI of brain without contrast from 07/19/18 showedverymild nonspecific subcortical and periventricularpunctateT2 and FLAIR hyperintense foci.  PAST MEDICAL HISTORY: Past Medical History:  Diagnosis Date  . Anemia    2002 after having hysterectomy  . Anxiety   . Chronic pain   . Depression   . Fibromyalgia   . GERD (gastroesophageal reflux disease)   . History of kidney stones   . Lupus ([place])   . Migraine headache   . PONV (postoperative nausea and vomiting)   . Sleep apnea    "Not enough for a CPAP".    MEDICATIONS: Current Outpatient Medications on File Prior to Visit  Medication Sig Dispense Refill  . Cholecalciferol (VITAMIN D PO) Take 1 tablet by mouth daily.    . clonazePAM (KLONOPIN) 1 MG tablet Take 1 mg by mouth at bedtime.    Marland Kitchen escitalopram (LEXAPRO) 20 MG tablet Take 20 mg by mouth daily.    Marland Kitchen estradiol (ESTRACE) 1 MG tablet Take 1 mg by mouth daily.    Nyoka Cowden Tea, Camellia sinensis, (GREEN TEA PO) Take 1 tablet by mouth daily.    . MethylPREDNISolone Acetate (DEPO-MEDROL, PF, IJ) Inject as directed every 3 (three) months. Reported on 06/07/2015    . naproxen sodium (ALEVE) 220 MG tablet Take 220 mg by mouth 2 (two) times daily as needed (Pain). Reported on 06/07/2015    . oxycodone-acetaminophen (LYNOX) 10-300 MG per tablet Take 1 tablet by mouth every 4 (four) hours  as needed for pain. Reported on 06/07/2015    . propranolol ER (INDERAL LA) 60 MG 24 hr capsule Take 1 capsule (60 mg total) by mouth daily. 30 capsule 5  . Rimegepant Sulfate (NURTEC) 75 MG TBDP Take 1 tablet by mouth daily as needed (Maximum 1 tablet in 24 hours). 8 tablet 11  . SUMAtriptan (IMITREX) 100 MG tablet Take 1 tablet earliest onset of migraine.  May repeat once after 2 hours PRN.  Maximum 2 tablets in 24 hrs (Patient not taking: Reported on 05/31/2019) 10 tablet 3  . topiramate (TOPAMAX) 50 MG tablet Take 1 tablet (50 mg total) by mouth at bedtime. (Patient not taking: Reported on 05/31/2019) 30 tablet 3  . traMADol (ULTRAM) 50 MG tablet Take 50 mg by mouth every 6 (six) hours as needed for pain.    Marland Kitchen Ubrogepant (UBRELVY) 100 MG TABS Take 1 tablet by mouth as needed (May repeat in 2 hours.  Maximum 2 tablets in 24 hours.). 8 tablet 11  . VITAMIN E PO Take 1 tablet by mouth daily.     No current facility-administered medications on file prior to visit.    ALLERGIES: Allergies  Allergen Reactions  . Penicillins Swelling and Other (See Comments)    Blisters     FAMILY HISTORY: Family History  Problem Relation Age of Onset  . Hypertension Mother   . Diabetes Father   . Hypertension Sister        accidental death-fall, head injury  . Cancer Brother        lung  . Hypertension Brother   . Hypertension Sister   . Hypertension Brother   . Diabetes Brother   . Hypertension Sister   . Hypertension Brother   . Other Son        suicide   SOCIAL HISTORY: Social  History   Socioeconomic History  . Marital status: Married    Spouse name: Alvester Chou  . Number of children: 2  . Years of education: Not on file  . Highest education level: 12th grade  Occupational History  . Not on file  Tobacco Use  . Smoking status: Never Smoker  . Smokeless tobacco: Never Used  Vaping Use  . Vaping Use: Never used  Substance and Sexual Activity  . Alcohol use: No  . Drug use: No  . Sexual activity: Yes    Birth control/protection: Surgical  Other Topics Concern  . Not on file  Social History Narrative   Patient is right-handed. She lives with her husband in a one level home.. she drinks 6 oz of coffee a day. She walks 3 x a week for exercise.   Social Determinants of Health   Financial Resource Strain:   . Difficulty of Paying Living Expenses:   Food Insecurity:   . Worried About Charity fundraiser in the Last Year:   . Arboriculturist in the Last Year:   Transportation Needs:   . Film/video editor (Medical):   Marland Kitchen Lack of Transportation (Non-Medical):   Physical Activity:   . Days of Exercise per Week:   . Minutes of Exercise per Session:   Stress:   . Feeling of Stress :   Social Connections:   . Frequency of Communication with Friends and Family:   . Frequency of Social Gatherings with Friends and Family:   . Attends Religious Services:   . Active Member of Clubs or Organizations:   . Attends Archivist Meetings:   .  Marital Status:   Intimate Partner Violence:   . Fear of Current or Ex-Partner:   . Emotionally Abused:   Marland Kitchen Physically Abused:   . Sexually Abused:     PHYSICAL EXAM: *** General: No acute distress.  Patient appears well-groomed.   Head:  Normocephalic/atraumatic Eyes:  Fundi examined but not visualized Neck: supple, no paraspinal tenderness, full range of motion Heart:  Regular rate and rhythm Lungs:  Clear to auscultation bilaterally Back: No paraspinal tenderness Neurological Exam: alert and oriented to  person, place, and time. Attention span and concentration intact, recent and remote memory intact, fund of knowledge intact.  Speech fluent and not dysarthric, language intact.  CN II-XII intact. Bulk and tone normal, muscle strength 5/5 throughout.  Sensation to light touch, temperature and vibration intact.  Deep tendon reflexes 2+ throughout, toes downgoing.  Finger to nose and heel to shin testing intact.  Gait normal, Romberg negative.  IMPRESSION: Migraine without aura, without status migrainosus, not intractable  PLAN: 1.  For preventative management, *** 2.  For abortive therapy, *** 3.  Limit use of pain relievers to no more than 2 days out of week to prevent risk of rebound or medication-overuse headache. 4.  Keep headache diary 5.  Exercise, hydration, caffeine cessation, sleep hygiene, monitor for and avoid triggers 6. Follow up ***   Metta Clines, DO  CC: [employer], MD

## 2019-10-04 ENCOUNTER — Other Ambulatory Visit: Payer: Self-pay

## 2019-10-04 ENCOUNTER — Telehealth: Payer: 59 | Admitting: Neurology

## 2019-12-13 ENCOUNTER — Other Ambulatory Visit: Payer: Self-pay

## 2019-12-13 ENCOUNTER — Ambulatory Visit (HOSPITAL_COMMUNITY)
Admission: RE | Admit: 2019-12-13 | Discharge: 2019-12-13 | Disposition: A | Discharge status: Home / Self Care | Payer: 59 | Source: Ambulatory Visit | Attending: Internal Medicine | Admitting: Internal Medicine

## 2019-12-13 DIAGNOSIS — Z1231 Encounter for screening mammogram for malignant neoplasm of breast: Secondary | ICD-10-CM | POA: Diagnosis present

## 2020-03-17 ENCOUNTER — Encounter (HOSPITAL_COMMUNITY): Payer: Self-pay | Admitting: Emergency Medicine

## 2020-03-17 ENCOUNTER — Emergency Department (HOSPITAL_COMMUNITY): Payer: 59

## 2020-03-17 ENCOUNTER — Other Ambulatory Visit: Payer: Self-pay

## 2020-03-17 ENCOUNTER — Emergency Department (HOSPITAL_COMMUNITY)
Admission: EM | Admit: 2020-03-17 | Discharge: 2020-03-17 | Disposition: A | Discharge status: Home / Self Care | Payer: 59 | Attending: Emergency Medicine | Admitting: Emergency Medicine

## 2020-03-17 DIAGNOSIS — S60411A Abrasion of left index finger, initial encounter: Secondary | ICD-10-CM | POA: Insufficient documentation

## 2020-03-17 DIAGNOSIS — S63501A Unspecified sprain of right wrist, initial encounter: Secondary | ICD-10-CM

## 2020-03-17 DIAGNOSIS — S0003XA Contusion of scalp, initial encounter: Secondary | ICD-10-CM | POA: Diagnosis not present

## 2020-03-17 DIAGNOSIS — T148XXA Other injury of unspecified body region, initial encounter: Secondary | ICD-10-CM

## 2020-03-17 DIAGNOSIS — W0110XA Fall on same level from slipping, tripping and stumbling with subsequent striking against unspecified object, initial encounter: Secondary | ICD-10-CM | POA: Insufficient documentation

## 2020-03-17 DIAGNOSIS — Y93E5 Activity, floor mopping and cleaning: Secondary | ICD-10-CM | POA: Diagnosis not present

## 2020-03-17 DIAGNOSIS — Z959 Presence of cardiac and vascular implant and graft, unspecified: Secondary | ICD-10-CM | POA: Insufficient documentation

## 2020-03-17 DIAGNOSIS — Z23 Encounter for immunization: Secondary | ICD-10-CM | POA: Diagnosis not present

## 2020-03-17 DIAGNOSIS — W19XXXA Unspecified fall, initial encounter: Secondary | ICD-10-CM

## 2020-03-17 DIAGNOSIS — S6391XA Sprain of unspecified part of right wrist and hand, initial encounter: Secondary | ICD-10-CM | POA: Insufficient documentation

## 2020-03-17 DIAGNOSIS — S0990XA Unspecified injury of head, initial encounter: Secondary | ICD-10-CM | POA: Diagnosis present

## 2020-03-17 MED ORDER — TETANUS-DIPHTH-ACELL PERTUSSIS 5-2.5-18.5 LF-MCG/0.5 IM SUSY
0.5000 mL | PREFILLED_SYRINGE | Freq: Once | INTRAMUSCULAR | Status: AC
  Administered 2020-03-17: 0.5 mL via INTRAMUSCULAR
  Filled 2020-03-17: qty 0.5

## 2020-03-17 MED ORDER — DOUBLE ANTIBIOTIC 500-10000 UNIT/GM EX OINT
TOPICAL_OINTMENT | Freq: Once | CUTANEOUS | Status: AC
  Administered 2020-03-17: 2 via TOPICAL
  Filled 2020-03-17: qty 2

## 2020-03-17 NOTE — Discharge Instructions (Addendum)
Take Tylenol and Ibuprofen as needed for pain.   Use ice for the first 24 hours to your hand and then switch to heat.  Follow-up with orthopedics if you continue to have any symptoms or worsening pain.

## 2020-03-17 NOTE — ED Triage Notes (Signed)
Pt c/o a fall at 1300 today. Pt was cleaning cabinets and fell hitting the rt side of her head, and right arm on the counter.   Pt is  A&O x4. Pt denies taking blood thinners.

## 2020-03-17 NOTE — ED Provider Notes (Signed)
[facility] EMERGENCY DEPARTMENT Provider Note   CSN: [id number] Arrival date & time: 03/17/20  1330    History Chief Complaint  Patient presents with  . Fall    [name] is a 59 y.o. female with past medical history significant for chronic pain, fibromyalgia, lupus, migraine who presents for evaluation after mechanical fall.  Was standing on her counters when she went to reach back and fell backwards.  She hit the posterior right aspect of her head on her countertops.  Also sustained injury to her right forearm and left pointer finger.  She is unsure of her last tetanus.  Incident occurred just PTA.  She denies LOC or anticoagulation.  She was able to walk after fall however has generalized pain. Patient states she has generalized pain at baseline.  Does have a mild headache.  Denies visual field changes, paresthesias, weakness, chest pain, shortness of breath, abdominal pain, diarrhea, dysuria, paresthesias.  She was ambulatory prior to arrival.  Denies additional aggravating or alleviating factors.  History obtained from patient and past medical records. No interpreter used.  HPI     Past Medical History:  Diagnosis Date  . Anemia    2002 after having hysterectomy  . Anxiety   . Chronic pain   . Depression   . Fibromyalgia   . GERD (gastroesophageal reflux disease)   . History of kidney stones   . Lupus ([place])   . Migraine headache   . PONV (postoperative nausea and vomiting)   . Sleep apnea    "Not enough for a CPAP".    Patient Active Problem List   Diagnosis Date Noted  . Special screening for malignant neoplasms, colon 09/16/2017  . Abdominal pain, left lower quadrant 07/06/2012  . Depression 07/06/2012  . Lupus ([place]) 07/06/2012  . Chronic pain syndrome 07/06/2012  . FHx: migraine headaches 07/06/2012  . KNEE PAIN 03/16/2007  . BACK PAIN 03/16/2007  . FIBROMYALGIA/FIBROMYOSITIS 03/16/2007    Past Surgical History:  Procedure Laterality Date  .  APPENDECTOMY    . BREAST CYST ASPIRATION Left   . CARDIAC CATHETERIZATION     x 2 and both were normal  . CHOLECYSTECTOMY    . COLONOSCOPY WITH PROPOFOL N/A 11/05/2017   Procedure: COLONOSCOPY WITH PROPOFOL;  Surgeon: Rogene Houston, MD;  Location: [facility] SUITE;  Service: Endoscopy;  Laterality: N/A;  8:30  . complete hysterectomy    . POLYPECTOMY  11/05/2017   Procedure: POLYPECTOMY;  Surgeon: Rogene Houston, MD;  Location: [facility] SUITE;  Service: Endoscopy;;  sigmoid  . TONSILLECTOMY    . TUBAL LIGATION       OB History    Gravida  2   Para  2   Term  2   Preterm      AB      Living  1     SAB      TAB      Ectopic      Multiple      Live Births              Family History  Problem Relation Age of Onset  . Hypertension Mother   . Diabetes Father   . Hypertension Sister        accidental death-fall, head injury  . Cancer Brother        lung  . Hypertension Brother   . Hypertension Sister   . Hypertension Brother   . Diabetes Brother   . Hypertension  Sister   . Hypertension Brother   . Other Son        suicide    Social History   Tobacco Use  . Smoking status: Never Smoker  . Smokeless tobacco: Never Used  Vaping Use  . Vaping Use: Never used  Substance Use Topics  . Alcohol use: No  . Drug use: No    Home Medications Prior to Admission medications   Medication Sig Start Date End Date Taking? Authorizing Provider  Cholecalciferol (VITAMIN D PO) Take 1 tablet by mouth daily.    [provider]  clonazePAM (KLONOPIN) 1 MG tablet Take 1 mg by mouth at bedtime.    [provider]  escitalopram (LEXAPRO) 20 MG tablet Take 20 mg by mouth daily.    [provider]  estradiol (ESTRACE) 1 MG tablet Take 1 mg by mouth daily.    [provider]  Nyoka Cowden Tea, Camellia sinensis, (GREEN TEA PO) Take 1 tablet by mouth daily.    [provider]  MethylPREDNISolone Acetate (DEPO-MEDROL, PF, IJ) Inject as  directed every 3 (three) months. Reported on 06/07/2015    [provider]  naproxen sodium (ALEVE) 220 MG tablet Take 220 mg by mouth 2 (two) times daily as needed (Pain). Reported on 06/07/2015    [provider]  oxycodone-acetaminophen (LYNOX) 10-300 MG per tablet Take 1 tablet by mouth every 4 (four) hours as needed for pain. Reported on 06/07/2015    [provider]  propranolol ER (INDERAL LA) 60 MG 24 hr capsule Take 1 capsule (60 mg total) by mouth daily. 06/05/19   Pieter Partridge, DO  Rimegepant Sulfate (NURTEC) 75 MG TBDP Take 1 tablet by mouth daily as needed (Maximum 1 tablet in 24 hours). 06/05/19   Pieter Partridge, DO  SUMAtriptan (IMITREX) 100 MG tablet Take 1 tablet earliest onset of migraine.  May repeat once after 2 hours PRN.  Maximum 2 tablets in 24 hrs Patient not taking: Reported on 05/31/2019 10/11/18   Pieter Partridge, DO  topiramate (TOPAMAX) 50 MG tablet Take 1 tablet (50 mg total) by mouth at bedtime. Patient not taking: Reported on 05/31/2019 10/11/18   Pieter Partridge, DO  traMADol (ULTRAM) 50 MG tablet Take 50 mg by mouth every 6 (six) hours as needed for pain.    [provider]  Ubrogepant (UBRELVY) 100 MG TABS Take 1 tablet by mouth as needed (May repeat in 2 hours.  Maximum 2 tablets in 24 hours.). 06/26/19   Pieter Partridge, DO  VITAMIN E PO Take 1 tablet by mouth daily.    [provider]    Allergies    Penicillins  Review of Systems   Review of Systems  Constitutional: Negative.   HENT: Negative.   Respiratory: Negative.   Cardiovascular: Negative.   Gastrointestinal: Negative.   Genitourinary: Negative.   Musculoskeletal:       Right arm pain, left pointer finger pain  Skin:       Scattered abrasions and contusions to right forearm  Neurological: Positive for headaches. Negative for dizziness, tremors, seizures, syncope, facial asymmetry, speech difficulty, weakness, light-headedness and numbness.  All other systems  reviewed and are negative.   Physical Exam Updated Vital Signs BP (!) 144/81 (BP Location: Left Arm)   Pulse 73   Temp 98.4 F (36.9 C) (Oral)   Resp 16   Ht 5\' 3"  (1.6 m)   Wt 68.9 kg   SpO2 96%   BMI  26.93 kg/m   Physical Exam Vitals and nursing note reviewed.  Constitutional:      General: She is not in acute distress.    Appearance: She is well-developed. She is not ill-appearing, toxic-appearing or diaphoretic.  HENT:     Head: Normocephalic. Contusion present. No raccoon eyes, Battle's sign or abrasion.     Jaw: There is normal jaw occlusion.      Comments: Hematoma to right parietal, posterior scalp.  No lacerations    Mouth/Throat:     Lips: Pink.     Mouth: Mucous membranes are moist.     Pharynx: Oropharynx is clear. Uvula midline.     Comments: No drooling, dysphagia or trismus.  Tongue midline.  No loose dentition Eyes:     Extraocular Movements: Extraocular movements intact.     Conjunctiva/sclera: Conjunctivae normal.     Pupils: Pupils are equal, round, and reactive to light.     Comments: EOMs intact.  No nystagmus  Neck:     Trachea: Trachea and phonation normal.     Comments: Generalized tenderness to bilateral trapezius however no midline spinal tenderness, crepitus or step-off Cardiovascular:     Rate and Rhythm: Normal rate.     Pulses: Normal pulses.          Radial pulses are 2+ on the right side and 2+ on the left side.       Dorsalis pedis pulses are 2+ on the right side and 2+ on the left side.     Heart sounds: Normal heart sounds.  Pulmonary:     Effort: Pulmonary effort is normal. No respiratory distress.     Breath sounds: Normal breath sounds and air entry.     Comments: Clear to auscultation bilaterally wheeze, rhonchi or rales.  Speaks in full sentences without difficulty. Chest:     Comments: No crepitus, step-offs.  Equal rise and fall to chest. Abdominal:     General: Bowel sounds are normal. There is no distension.      Palpations: Abdomen is soft.     Tenderness: There is no abdominal tenderness. There is no right CVA tenderness, left CVA tenderness, guarding or rebound.     Hernia: No hernia is present.     Comments: Soft, nontender without rebound or guarding.  No overlying skin changes  Musculoskeletal:        General: Normal range of motion.     Cervical back: Full passive range of motion without pain and normal range of motion.     Comments: No midline spinal tenderness, crepitus or step-offs.  Pelvis stable, nontender palpation.  No shortening or rotation of legs.  Able to straight leg raise bilaterally without difficulty.  Able to plantarflex and dorsiflex at bilateral ankles, knees.  Tenderness and swelling to left second digit.  No bony tenderness to wrists, forearm or humerus to the left extremity.  Patient with diffuse tenderness to right hand, forearm and humerus.  She is able to lift bilateral arms above head.  Skin:    General: Skin is warm and dry.     Capillary Refill: Capillary refill takes less than 2 seconds.     Comments: Scattered contusions and abrasions.  No lacerations to suture  Neurological:     General: No focal deficit present.     Mental Status: She is alert.     Cranial Nerves: Cranial nerves are intact.     Sensory: Sensation is intact.     Motor: Motor function is intact.  Gait: Gait is intact.     Comments: Cranial nerves II through XII grossly intact. Ambulatory but difficulty Intact sensation    ED Results / Procedures / Treatments   Labs (all labs ordered are listed, but only abnormal results are displayed) Labs Reviewed - No data to display  EKG None  Radiology DG Pelvis 1-2 Views  Result Date: 03/17/2020 CLINICAL DATA:  Recent fall with pelvic pain, initial encounter EXAM: PELVIS - 1 VIEW COMPARISON:  None. FINDINGS: There is no evidence of pelvic fracture or diastasis. No pelvic bone lesions are seen. IMPRESSION: No acute abnormality noted. Electronically  Signed   By: Inez Catalina M.D.   On: 03/17/2020 15:13   DG Forearm Right  Result Date: 03/17/2020 CLINICAL DATA:  Pt c/o a fall at 1300 today. Pt was cleaning cabinets and fell hitting the rt side of her head, and right arm on the counter. EXAM: RIGHT FOREARM - 2 VIEW COMPARISON:  None. FINDINGS: There is no evidence of fracture or other focal bone lesions. Soft tissues are unremarkable. IMPRESSION: Negative. Electronically Signed   By: Lajean Manes M.D.   On: 03/17/2020 15:11   CT Head Wo Contrast  Result Date: 03/17/2020 CLINICAL DATA:  Patient was clean CT evidence an fell hitting the right side of the head. EXAM: CT HEAD WITHOUT CONTRAST CT CERVICAL SPINE WITHOUT CONTRAST TECHNIQUE: Multidetector CT imaging of the head and cervical spine was performed following the standard protocol without intravenous contrast. Multiplanar CT image reconstructions of the cervical spine were also generated. COMPARISON:  None. FINDINGS: CT HEAD FINDINGS Brain: No evidence of acute infarction, hemorrhage, hydrocephalus, extra-axial collection or mass lesion/mass effect. Vascular: No hyperdense vessel or unexpected calcification. Skull: No osseous abnormality. Sinuses/Orbits: Visualized paranasal sinuses are clear. Visualized mastoid sinuses are clear. Visualized orbits demonstrate no focal abnormality. Other: None CT CERVICAL SPINE FINDINGS Alignment: 2 mm anterolisthesis of C2 on C3 secondary to facet disease. Skull base and vertebrae: No acute fracture. No aggressive lytic or sclerotic osseous lesion. Soft tissues and spinal canal: Intraspinal soft tissues are not fully imaged on this examination due to poor soft tissue contrast, but there is no gross soft tissue abnormality. No prevertebral fluid or swelling. No visible canal hematoma. Disc levels: Degenerative disease with disc height loss at C5-6 and C6-7. Mild bilateral facet arthropathy at C2-3. Moderate left facet arthropathy at C3-4. At C5-6 there is a mild  broad-based disc bulge and mild left facet arthropathy. At C6-7 there is a broad-based disc bulge, bilateral uncovertebral degenerative changes, bilateral foraminal narrowing and bilateral facet arthropathy. Upper chest: Lung apices are clear. Other: No fluid collection or hematoma. IMPRESSION: 1. No acute intracranial pathology. 2. No acute osseous injury of the cervical spine. 3. Cervical spine spondylosis as described above. Electronically Signed   By: Kathreen Devoid   On: 03/17/2020 15:27   CT Cervical Spine Wo Contrast  Result Date: 03/17/2020 CLINICAL DATA:  Patient was clean CT evidence an fell hitting the right side of the head. EXAM: CT HEAD WITHOUT CONTRAST CT CERVICAL SPINE WITHOUT CONTRAST TECHNIQUE: Multidetector CT imaging of the head and cervical spine was performed following the standard protocol without intravenous contrast. Multiplanar CT image reconstructions of the cervical spine were also generated. COMPARISON:  None. FINDINGS: CT HEAD FINDINGS Brain: No evidence of acute infarction, hemorrhage, hydrocephalus, extra-axial collection or mass lesion/mass effect. Vascular: No hyperdense vessel or unexpected calcification. Skull: No osseous abnormality. Sinuses/Orbits: Visualized paranasal sinuses are clear. Visualized mastoid sinuses are clear. Visualized  orbits demonstrate no focal abnormality. Other: None CT CERVICAL SPINE FINDINGS Alignment: 2 mm anterolisthesis of C2 on C3 secondary to facet disease. Skull base and vertebrae: No acute fracture. No aggressive lytic or sclerotic osseous lesion. Soft tissues and spinal canal: Intraspinal soft tissues are not fully imaged on this examination due to poor soft tissue contrast, but there is no gross soft tissue abnormality. No prevertebral fluid or swelling. No visible canal hematoma. Disc levels: Degenerative disease with disc height loss at C5-6 and C6-7. Mild bilateral facet arthropathy at C2-3. Moderate left facet arthropathy at C3-4. At C5-6  there is a mild broad-based disc bulge and mild left facet arthropathy. At C6-7 there is a broad-based disc bulge, bilateral uncovertebral degenerative changes, bilateral foraminal narrowing and bilateral facet arthropathy. Upper chest: Lung apices are clear. Other: No fluid collection or hematoma. IMPRESSION: 1. No acute intracranial pathology. 2. No acute osseous injury of the cervical spine. 3. Cervical spine spondylosis as described above. Electronically Signed   By: Kathreen Devoid   On: 03/17/2020 15:27   DG Humerus Right  Result Date: 03/17/2020 CLINICAL DATA:  Recent fall with arm pain, initial encounter EXAM: RIGHT HUMERUS - 2+ VIEW COMPARISON:  None. FINDINGS: There is no evidence of fracture or other focal bone lesions. Soft tissues are unremarkable. IMPRESSION: No acute abnormality noted. Electronically Signed   By: Inez Catalina M.D.   On: 03/17/2020 15:12   DG Hand Complete Right  Result Date: 03/17/2020 CLINICAL DATA:  Pt c/o a fall at 1300 today. Pt was cleaning cabinets and fell hitting the rt side of her head, and right arm on the counter. EXAM: RIGHT HAND - COMPLETE 3+ VIEW COMPARISON:  None. FINDINGS: No fracture or bone lesion. Joints are normally spaced and aligned. Soft tissues are unremarkable. IMPRESSION: Negative. Electronically Signed   By: Lajean Manes M.D.   On: 03/17/2020 15:12   DG Finger Index Left  Result Date: 03/17/2020 CLINICAL DATA:  Pt c/o a fall at 1300 today. Pt was cleaning cabinets and fell hitting the rt side of her head, and right arm on the counter. EXAM: LEFT INDEX FINGER 2+V COMPARISON:  None. FINDINGS: No fracture or bone lesion. Joints normally spaced and aligned. Soft tissues are unremarkable. IMPRESSION: Negative. Electronically Signed   By: Lajean Manes M.D.   On: 03/17/2020 15:11    Procedures .Ortho Injury Treatment  Date/Time: 03/17/2020 4:09 PM Performed by: Nettie Elm, PA-C Authorized by: Nettie Elm, PA-C   Consent:     Consent obtained:  Verbal   Consent given by:  Patient   Risks discussed:  Fracture, nerve damage, restricted joint movement, vascular damage, stiffness, recurrent dislocation and irreducible dislocation   Alternatives discussed:  No treatment, alternative treatment, immobilization, referral and delayed treatmentInjury location: forearm    (including critical care time)  Medications Ordered in ED Medications  Tdap (BOOSTRIX) injection 0.5 mL (0.5 mLs Intramuscular Given 03/17/20 1628)  polymixin-bacitracin (POLYSPORIN) ointment (2 application Topical Given 03/17/20 1629)   ED Course  I have reviewed the triage vital signs and the nursing notes.  Pertinent labs & imaging results that were available during my care of the patient were reviewed by me and considered in my medical decision making (see chart for details).  59 year old presents for evaluation after mechanical fall.  She is afebrile, nonseptic, not ill-appearing.  Ambulatory prior to arrival.  No syncope, LOC or anticoagulation.  Patient with moderate sized right parietal hematoma.  She is scattered contusions and  abrasions.  Tenderness and soft tissue swelling to left pointer finger as well as right upper extremity.  Unsure last tetanus will update.  Neuromusculoskeletal exam to lower extremity.  Pelvis stable, nontender palpation.  We will plan on imaging and reassess patient does not anything for pain at this time.  Imaging personally reviewed and interpreted:  CT head without acute changes CT cervical without acute changes DG pelvis without acute changes DG hand without fracture, dislocation DG humerus without acute fracture, dislocation DG forearm without fracture dislocation DG finger without acute changes  Patient reassessed.  Ambulatory but difficulty.  Discussed imaging.  Given she does have moderate swelling to distal forearm, lateral right hand placed in Velcro wrist splint.  Discussed RICE for symptomatic management.   Discussed anticipatory guidance.  We will have her follow-up with orthopedics if her symptoms are not improved.  Her tetanus was updated here in the ED.  Her wounds were cleaned.  No lacerations to suture.  The patient has been appropriately medically screened and/or stabilized in the ED. I have low suspicion for any other emergent medical condition which would require further screening, evaluation or treatment in the ED or require inpatient management.  Patient is hemodynamically stable and in no acute distress.  Patient able to ambulate in department prior to ED.  Evaluation does not show acute pathology that would require ongoing or additional emergent interventions while in the emergency department or further inpatient treatment.  I have discussed the diagnosis with the patient and answered all questions.  Pain is been managed while in the emergency department and patient has no further complaints prior to discharge.  Patient is comfortable with plan discussed in room and is stable for discharge at this time.  I have discussed strict return precautions for returning to the emergency department.  Patient was encouraged to follow-up with PCP/specialist refer to at discharge.    MDM Rules/Calculators/A&P                           Final Clinical Impression(s) / ED Diagnoses Final diagnoses:  Fall, initial encounter  Sprain of right wrist, initial encounter  Hematoma    Rx / DC Orders ED Discharge Orders    None       Henning Ehle A, PA-C 03/17/20 1633    Milton Ferguson, MD 03/18/20 [phone]

## 2020-10-10 ENCOUNTER — Ambulatory Visit: Payer: 59

## 2020-10-10 ENCOUNTER — Ambulatory Visit (INDEPENDENT_AMBULATORY_CARE_PROVIDER_SITE_OTHER): Payer: 59 | Admitting: Orthopedic Surgery

## 2020-10-10 ENCOUNTER — Other Ambulatory Visit: Payer: Self-pay

## 2020-10-10 ENCOUNTER — Encounter: Payer: Self-pay | Admitting: Orthopedic Surgery

## 2020-10-10 VITALS — BP 129/85 | HR 79 | Ht 63.0 in | Wt 156.2 lb

## 2020-10-10 DIAGNOSIS — M25562 Pain in left knee: Secondary | ICD-10-CM

## 2020-10-10 DIAGNOSIS — M25462 Effusion, left knee: Secondary | ICD-10-CM

## 2020-10-10 DIAGNOSIS — M5442 Lumbago with sciatica, left side: Secondary | ICD-10-CM

## 2020-10-10 DIAGNOSIS — G8929 Other chronic pain: Secondary | ICD-10-CM

## 2020-10-10 MED ORDER — PREDNISONE 10 MG (48) PO TBPK: ORAL_TABLET | Freq: Every day | ORAL | 0 refills | Status: DC

## 2020-10-10 MED ORDER — GABAPENTIN 100 MG PO CAPS: 100.0000 mg | ORAL_CAPSULE | Freq: Three times a day (TID) | ORAL | 2 refills | Status: DC

## 2020-10-10 NOTE — Progress Notes (Addendum)
NEW PROBLEM//OFFICE VISIT  Summary assessment and plan:   Encounter Diagnoses  Name Primary?   Acute pain of left knee    Effusion of knee joint, left    Chronic left-sided low back pain with left-sided sciatica Yes   Meds ordered this encounter  Medications   gabapentin (NEURONTIN) 100 MG capsule    Sig: Take 1 capsule (100 mg total) by mouth 3 (three) times daily.    Dispense:  90 capsule    Refill:  2   predniSONE (STERAPRED UNI-PAK 48 TAB) 10 MG (48) TBPK tablet    Sig: Take by mouth daily.    Dispense:  48 tablet    Refill:  0    Order therapy for the back   A/I left knee   Follow-up visit scheduled for 8 weeks  Chief Complaint  Patient presents with   New Patient (Initial Visit)   Knee Pain    Left knee pain   Christine [name] is 60 years old I saw her back in 2014 for left knee pain which work-up reviewed was problems with her lower back  She now presents with diffuse pain left knee left-sided lower back pain with radiation into the left foot  Dr. Riley Kill has made a referral.  She indicates that she got an IM shot of cortisone and a Dosepak which helped for short period of time.  She was placed on meloxicam but it seemed to cause some subcutaneous bleeding so she stopped that  She has been taking Aleve instead  Her symptoms started in March of this year, she does have some intermittent weakness and giving way symptoms in the left lower extremity    MEDICAL DECISION MAKING  A.  Encounter Diagnoses  Name Primary?   Acute pain of left knee    Effusion of knee joint, left    Chronic left-sided low back pain with left-sided sciatica Yes    B. DATA ANALYSED:   IMAGING: Interpretation of images: Images were obtained in the office alignment was normal there was a hint of an effusion and there were minimal degenerative changes  Orders: Physical therapy  Outside records reviewed: None   C. MANAGEMENT   start her on a Dosepak and gabapentin and send her to physical  therapy for her back  Aspiration injection of the left knee  Patient agreed to aspiration injection left knee and site was confirmed.  The area was prepped with alcohol and ethyl chloride and a suprapatellar lateral approach was used.  18-gauge needle was used to aspirate 8 cc of clear yellow fluid and then this was followed by injecting 40 mg of Depo-Medrol with 2 cc 1% lidocaine.  This was tolerated well with no complications    Meds ordered this encounter  Medications   gabapentin (NEURONTIN) 100 MG capsule    Sig: Take 1 capsule (100 mg total) by mouth 3 (three) times daily.    Dispense:  90 capsule    Refill:  2   predniSONE (STERAPRED UNI-PAK 48 TAB) 10 MG (48) TBPK tablet    Sig: Take by mouth daily.    Dispense:  48 tablet    Refill:  0     BP 129/85   [name] 79   Ht 5\' 3"  (1.6 m)   Wt 156 lb 3.2 oz (70.9 kg)   BMI 27.67 kg/m    General appearance: Well-developed well-nourished no gross deformities  Cardiovascular normal [name] and perfusion normal color without edema  Neurologically no sensation loss or  deficits or pathologic reflexes  Psychological: Awake alert and oriented x3 mood and affect normal  Skin no lacerations or ulcerations no nodularity no palpable masses, no erythema or nodularity  Musculoskeletal: Left knee trace joint effusion.  Tenderness is diffuse without localization normal range of motion normal stability normal muscle tone   ROS back pain left hip pain is over the gluteal area pain radiates down to the left foot   Past Medical History:  Diagnosis Date   Anemia    2002 after having hysterectomy   Anxiety    Chronic pain    Depression    Fibromyalgia    GERD (gastroesophageal reflux disease)    History of kidney stones    Lupus (HCC)    Migraine headache    PONV (postoperative nausea and vomiting)    Sleep apnea    "Not enough for a CPAP".    Past Surgical History:  Procedure Laterality Date   APPENDECTOMY     BREAST CYST  ASPIRATION Left    CARDIAC CATHETERIZATION     x 2 and both were normal   CHOLECYSTECTOMY     COLONOSCOPY WITH PROPOFOL N/A 11/05/2017   Procedure: COLONOSCOPY WITH PROPOFOL;  Surgeon: Rogene Houston, MD;  [facility];  Service: Endoscopy;  Laterality: N/A;  8:30   complete hysterectomy     POLYPECTOMY  11/05/2017   Procedure: POLYPECTOMY;  Surgeon: Rogene Houston, MD;  [facility];  Service: Endoscopy;;  sigmoid   TONSILLECTOMY     TUBAL LIGATION      Family History  Problem Relation Age of Onset   Hypertension Mother    Diabetes Father    Hypertension Sister        accidental death-fall, head injury   Cancer Brother        lung   Hypertension Brother    Hypertension Sister    Hypertension Brother    Diabetes Brother    Hypertension Sister    Hypertension Brother    Other Son        suicide   Social History   Tobacco Use   Smoking status: Never   Smokeless tobacco: Never  Vaping Use   Vaping Use: Never used  Substance Use Topics   Alcohol use: No   Drug use: No    Allergies  Allergen Reactions   Penicillins Swelling and Other (See Comments)    Blisters     Current Meds  Medication Sig   Cholecalciferol (VITAMIN D PO) Take 1 tablet by mouth daily.   escitalopram (LEXAPRO) 20 MG tablet Take 20 mg by mouth daily.   estradiol (ESTRACE) 2 MG tablet Take 2 mg by mouth daily.   gabapentin (NEURONTIN) 100 MG capsule Take 1 capsule (100 mg total) by mouth 3 (three) times daily.   Green Tea, Camellia sinensis, (GREEN TEA PO) Take 1 tablet by mouth daily.   hydrOXYzine (ATARAX/VISTARIL) 25 MG tablet Take 25 mg by mouth daily.   meloxicam (MOBIC) 7.5 MG tablet Take 7.5 mg by mouth 2 (two) times daily as needed.   naproxen sodium (ALEVE) 220 MG tablet Take 220 mg by mouth 2 (two) times daily as needed (Pain). Reported on 06/07/2015   predniSONE (STERAPRED UNI-PAK 48 TAB) 10 MG (48) TBPK tablet Take by mouth daily.   topiramate (TOPAMAX) 50 MG  tablet Take 1 tablet (50 mg total) by mouth at bedtime.   traMADol (ULTRAM) 50 MG tablet Take 50 mg by mouth every 6 (  six) hours as needed for pain.   Ubrogepant (UBRELVY) 100 MG TABS Take 1 tablet by mouth as needed (May repeat in 2 hours.  Maximum 2 tablets in 24 hours.).        Arther Abbott, MD  10/10/2020 9:14 AM

## 2020-10-10 NOTE — Patient Instructions (Signed)
Start gabapentin and dose pack   Start therapy

## 2020-10-25 ENCOUNTER — Ambulatory Visit (HOSPITAL_COMMUNITY): Payer: 59 | Attending: Orthopedic Surgery | Admitting: Physical Therapy

## 2020-10-25 ENCOUNTER — Other Ambulatory Visit: Payer: Self-pay

## 2020-10-25 DIAGNOSIS — M5416 Radiculopathy, lumbar region: Secondary | ICD-10-CM | POA: Insufficient documentation

## 2020-10-25 DIAGNOSIS — M6281 Muscle weakness (generalized): Secondary | ICD-10-CM | POA: Diagnosis present

## 2020-10-25 NOTE — Therapy (Addendum)
[place], [place], [address] Phone: [phone]   Fax:  [phone]  Physical Therapy Evaluation  Patient Details  Name: [name] MRN: [id number] Date of Birth: [birth year] Referring Provider (PT): Arther Abbott   Encounter Date: 10/25/2020   PT End of Session - 10/25/20 1010     Visit Number 1    Number of Visits 12    Date for PT Re-Evaluation 12/06/20    Authorization Type UHC   Authorization - Visit Number 1    Authorization - Number of Visits 20    Progress Note Due on Visit 10    PT Start Time 0920    PT Stop Time 1005    PT Time Calculation (min) 45 min    Activity Tolerance Patient tolerated treatment well    Behavior During Therapy [facility] for tasks assessed/performed             Past Medical History:  Diagnosis Date   Anemia    2002 after having hysterectomy   Anxiety    Chronic pain    Depression    Fibromyalgia    GERD (gastroesophageal reflux disease)    History of kidney stones    Lupus (HCC)    Migraine headache    PONV (postoperative nausea and vomiting)    Sleep apnea    "Not enough for a CPAP".    Past Surgical History:  Procedure Laterality Date   APPENDECTOMY     BREAST CYST ASPIRATION Left    CARDIAC CATHETERIZATION     x 2 and both were normal   CHOLECYSTECTOMY     COLONOSCOPY WITH PROPOFOL N/A 11/05/2017   Procedure: COLONOSCOPY WITH PROPOFOL;  Surgeon: Rogene Houston, MD;  [facility];  Service: Endoscopy;  Laterality: N/A;  8:30   complete hysterectomy     POLYPECTOMY  11/05/2017   Procedure: POLYPECTOMY;  Surgeon: Rogene Houston, MD;  [facility];  Service: Endoscopy;;  sigmoid   TONSILLECTOMY     TUBAL LIGATION      There were no vitals filed for this visit.    Subjective Assessment - 10/25/20 0930     Subjective [name] states that she has had radiculopathy in 2014 and has had periodic bouts every since.  In December  she was standing on her counter cleaning her countertops and fell off and began having increased pain since January.  She has been to the MD and gotten a shot and a prednisone pak which eased that pain but it continues to give her problems therefore she was referred to skilled PT.    Pertinent History chronic low back pain, H/A, fibro, lupus    Limitations Sitting;Standing;Walking;House hold activities;Lifting    How long can you sit comfortably? 20-30 minutes.    How long can you stand comfortably? 20 minutes    How long can you walk comfortably? 20 mintues    Currently in Pain? Yes    Pain Score 3    worst pain:  6/10;  Best:  2/10,( prior to the shot pain was significantly worse.)   Pain Location Back    Pain Orientation Lower    Pain Descriptors / Indicators Aching    Pain Type Chronic pain    Pain Radiating Towards LT knee    Pain Onset More than a month ago    Pain Frequency Intermittent    Aggravating Factors  sititng, getting up from a squatted  postion    Pain Relieving Factors steroid,    Effect of Pain on Daily Activities limits                [facility] PT Assessment - 10/25/20 0001       Assessment   Medical Diagnosis Lt lumbar radiculopathy    Referring Provider (PT) Arther Abbott    Onset Date/Surgical Date 04/15/20    Next MD Visit if needed    Prior Therapy yes years ago      Precautions   Precautions None      Restrictions   Weight Bearing Restrictions No      Balance Screen   Has the patient fallen in the past 6 months Yes    How many times? 1    Has the patient had a decrease in activity level because of a fear of falling?  No    Is the patient reluctant to leave their home because of a fear of falling?  No      Home Ecologist residence      Prior Function   Level of Independence Independent    Vocation Unemployed    Leisure crafts      Cognition   Overall Cognitive Status Within Functional Limits for tasks  assessed      Observation/Other Assessments   Focus on Therapeutic Outcomes (FOTO)  52; 48% affected      Functional Tests   Functional tests Single leg stance;Sit to Stand      Single Leg Stance   Comments rt; 30" Lt 25"      Sit to Stand   Comments 13 in 30"      ROM / Strength   AROM / PROM / Strength AROM;Strength      AROM   AROM Assessment Site Lumbar    Lumbar Extension 35   tested prone; repeated extension: no change.     Strength   Strength Assessment Site Hip;Knee;Ankle    Right/Left Hip Right;Left    Right Hip Flexion 5/5    Right Hip Extension 5/5    Right Hip ABduction 5/5    Left Hip Flexion 3/5    Left Hip Extension 3+/5    Left Hip ABduction 3/5    Right/Left Knee Right;Left    Right Knee Flexion 5/5    Left Knee Flexion 4/5    Right/Left Ankle Right;Left      Flexibility   Soft Tissue Assessment /Muscle Length yes    Hamstrings Lt : 160; RT 175                        Objective measurements completed on examination: See above findings.       [place] Adult PT Treatment/Exercise - 10/25/20 0001       Exercises   Exercises Lumbar      Lumbar Exercises: Stretches   Active Hamstring Stretch Left;3 reps;30 seconds    Single Knee to Chest Stretch Left;1 rep;60 seconds    Standing Extension 4 reps    Prone on Elbows Stretch 1 rep;3 reps                    PT Education - 10/25/20 1008     Education Details Why it is important to have good posture, and good core strength; HEP    Methods Explanation;Demonstration;Tactile cues;Handout;Verbal cues  PT Short Term Goals - 10/25/20 1018       PT SHORT TERM GOAL #1   Title Pt to be completing a HEP to decrease nerve irritation so that pain is only to mid thigh area.    Time 3    Period Weeks    Status New    Target Date 11/15/20      PT SHORT TERM GOAL #2   Title Pt to only be waking from sleep 1x a night    Time 3    Period Weeks    Status New       PT SHORT TERM GOAL #3   Title PT core and LE strength increased 1/2 grade to allow pt to get up from lower couches without increased pain    Time 3    Period Weeks    Status New               PT Long Term Goals - 10/25/20 1020       PT LONG TERM GOAL #1   Title Pt to be I in an advance HEP to allow pt to have had no radicular sx in her lt LE in the past week    Time 6    Period Weeks    Status New    Target Date 12/06/20      PT LONG TERM GOAL #2   Title PT to increast LE strength to allow pt to be able to squat and return from a squat without difficulty for house and yard work.    Time 6    Period Weeks    Status New      PT LONG TERM GOAL #3   Title Pt core strength to be increased to allow pt to be able to single leg stance for 50" on both LE    Time 6    Period Weeks    Status New      PT LONG TERM GOAL #4   Title PT to be able to verbalize the importance of good posture and proper body mechanics in maintaining a healthy back.    Time 6    Period Weeks    Status New                    Plan - 10/25/20 1011     Clinical Impression Statement Ms. Ammons is a 60 yo female who has been having lumbar radiculopathy into her LT leg for over 8 years now.  The pain comes and goes with the latest acute bout occuring in January.  The pain was excruciating but she has had an injection and a steroid pac which has made the pain tolerable.  She is having difficultty sleeping waking several times due to leg pain.  She is now being referred to skilled PT.  Evaluation demonstrates decreased knowledge of proper body mechanics, decreased strength, decreased activity tolerance.  Ms. Yousuf will benefit from skilled PT to improvove her knowledge of body mechanics for housework, bed mobility and gardening as well as improving her core and LE strength to allow decreased pain to imporove her activty tolerance.    Personal Factors and Comorbidities Time since onset of  injury/illness/exacerbation    Examination-Activity Limitations Bend;Carry;Lift;Locomotion Level;Stand;Squat    Examination-Participation Restrictions Cleaning;Community Activity;Driving;Laundry;Shop    Stability/Clinical Decision Making Stable/Uncomplicated    Clinical Decision Making Low    Rehab Potential Good    PT Frequency 2x / week    PT  Duration 6 weeks    PT Treatment/Interventions Patient/family education;Therapeutic activities;Therapeutic exercise;Manual techniques    PT Next Visit Plan begin stabilization, stretching  program, dead bug, heelslide, bridge, press up. prone single leg, single arm and opposite arm leg raise progressing to standing stabilization and educating how to incorporate these into functional tasks ie lunging = vacuuming, squatting= getting things out of lower cabinets/dryer...             Patient will benefit from skilled therapeutic intervention in order to improve the following deficits and impairments:  Decreased activity tolerance, Decreased balance, Decreased mobility, Decreased strength, Difficulty walking, Pain  Visit Diagnosis: Radiculopathy, lumbar region - Plan: PT plan of care cert/re-cert  Muscle weakness (generalized) - Plan: PT plan of care cert/re-cert     Problem List Patient Active Problem List   Diagnosis Date Noted   Special screening for malignant neoplasms, colon 09/16/2017   Abdominal pain, left lower quadrant 07/06/2012   Depression 07/06/2012   Lupus ([place]) 07/06/2012   Chronic pain syndrome 07/06/2012   FHx: migraine headaches 07/06/2012   KNEE PAIN 03/16/2007   BACK PAIN 03/16/2007   FIBROMYALGIA/FIBROMYOSITIS 03/16/2007   Rayetta Humphrey, PT CLT [phone]  10/25/2020, 10:27 AM  [place], [place], [address] Phone: [phone]   Fax:  [phone]  Name: DRIANA DAZEY MRN: [id number] Date of Birth: 02-Sep-1960

## 2020-10-30 ENCOUNTER — Ambulatory Visit (HOSPITAL_COMMUNITY): Payer: 59 | Admitting: Physical Therapy

## 2020-10-30 ENCOUNTER — Other Ambulatory Visit: Payer: Self-pay

## 2020-10-30 DIAGNOSIS — M6281 Muscle weakness (generalized): Secondary | ICD-10-CM

## 2020-10-30 DIAGNOSIS — M5416 Radiculopathy, lumbar region: Secondary | ICD-10-CM | POA: Diagnosis not present

## 2020-10-30 NOTE — Patient Instructions (Signed)
Bridge    Lie back, legs bent. Inhale, pressing hips up. Keeping ribs in, lengthen lower back. Exhale, rolling down along spine from top. Repeat __10__ times. Do __2__ sessions per day.   Piriformis Stretch, Sitting    Sit, one ankle on opposite knee, same-side hand on crossed knee. Push down on knee, keeping spine straight. Lean torso forward, with flat back, until tension is felt in hamstrings and gluteals of crossed-leg side. Hold _30__ seconds. Repeat _3__ times per session. Do _2__ sessions per day.

## 2020-10-30 NOTE — Therapy (Signed)
[place], [place], [address] Phone: [phone]   Fax:  [phone]  Physical Therapy Treatment  Patient Details  Name: [name] MRN: [id number] Date of Birth: 1960-04-18 Referring Provider (PT): Arther Abbott   Encounter Date: 10/30/2020   PT End of Session - 10/30/20 1648     Visit Number 2    Number of Visits 12    Date for PT Re-Evaluation 12/06/20    Authorization Type UHC    Authorization - Visit Number 2    Authorization - Number of Visits 20    Progress Note Due on Visit 10    PT Start Time 1320    PT Stop Time 1404    PT Time Calculation (min) 44 min    Activity Tolerance Patient tolerated treatment well    Behavior During Therapy [facility] for tasks assessed/performed             Past Medical History:  Diagnosis Date   Anemia    2002 after having hysterectomy   Anxiety    Chronic pain    Depression    Fibromyalgia    GERD (gastroesophageal reflux disease)    History of kidney stones    Lupus (HCC)    Migraine headache    PONV (postoperative nausea and vomiting)    Sleep apnea    "Not enough for a CPAP".    Past Surgical History:  Procedure Laterality Date   APPENDECTOMY     BREAST CYST ASPIRATION Left    CARDIAC CATHETERIZATION     x 2 and both were normal   CHOLECYSTECTOMY     COLONOSCOPY WITH PROPOFOL N/A 11/05/2017   Procedure: COLONOSCOPY WITH PROPOFOL;  Surgeon: Rogene Houston, MD;  [facility];  Service: Endoscopy;  Laterality: N/A;  8:30   complete hysterectomy     POLYPECTOMY  11/05/2017   Procedure: POLYPECTOMY;  Surgeon: Rogene Houston, MD;  [facility];  Service: Endoscopy;;  sigmoid   TONSILLECTOMY     TUBAL LIGATION      There were no vitals filed for this visit.   Subjective Assessment - 10/30/20 1523     Subjective Pt with continued "sciatic" pain running down Lt LE but mostly Lt knee pain and issues.    Currently in Pain?  Yes    Pain Score 3     Pain Location Back                               [facility] Adult PT Treatment/Exercise - 10/30/20 0001       Lumbar Exercises: Stretches   Active Hamstring Stretch Left;3 reps;30 seconds    Single Knee to Chest Stretch Left;1 rep;60 seconds    Standing Extension 10 reps    Prone on Elbows Stretch Limitations    Piriformis Stretch Right;Left;2 reps;30 seconds    Piriformis Stretch Limitations seated      Lumbar Exercises: Supine   Ab Set 10 reps    Dead Bug 10 reps    Bridge 10 reps      Lumbar Exercises: Sidelying   Hip Abduction 10 reps                    PT Education - 10/30/20 1525     Education Details review of goals, POC moving foward    Person(s) Educated Patient    Methods  Explanation    Comprehension Verbalized understanding              PT Short Term Goals - 10/25/20 1018       PT SHORT TERM GOAL #1   Title Pt to be completing a HEP to decrease nerve irritation so that pain is only to mid thigh area.    Time 3    Period Weeks    Status New    Target Date 11/15/20      PT SHORT TERM GOAL #2   Title Pt to only be waking from sleep 1x a night    Time 3    Period Weeks    Status New      PT SHORT TERM GOAL #3   Title PT core and LE strength increased 1/2 grade to allow pt to get up from lower couches without increased pain    Time 3    Period Weeks    Status New               PT Long Term Goals - 10/25/20 1020       PT LONG TERM GOAL #1   Title Pt to be I in an advance HEP to allow pt to have had no radicular sx in her lt LE in the past week    Time 6    Period Weeks    Status New    Target Date 12/06/20      PT LONG TERM GOAL #2   Title PT to increast LE strength to allow pt to be able to squat and return from a squat without difficulty for house and yard work.    Time 6    Period Weeks    Status New      PT LONG TERM GOAL #3   Title Pt core strength to be increased to allow  pt to be able to single leg stance for 50" on both LE    Time 6    Period Weeks    Status New      PT LONG TERM GOAL #4   Title PT to be able to verbalize the importance of good posture and proper body mechanics in maintaining a healthy back.    Time 6    Period Weeks    Status New                   Plan - 10/30/20 1638     Clinical Impression Statement Reviewed goals and POC moving forward.  Added seated piriformis stretch and progressed with additional core stab exercises.  Pt able to complete all without increased pain or issues.  Minimal cues needed for form. Added bridge and seated piriformis stretch to HEP.    Personal Factors and Comorbidities Time since onset of injury/illness/exacerbation    Examination-Activity Limitations Bend;Carry;Lift;Locomotion Level;Stand;Squat    Examination-Participation Restrictions Cleaning;Community Activity;Driving;Laundry;Shop    Stability/Clinical Decision Making Stable/Uncomplicated    Rehab Potential Good    PT Frequency 2x / week    PT Duration 6 weeks    PT Treatment/Interventions Patient/family education;Therapeutic activities;Therapeutic exercise;Manual techniques    PT Next Visit Plan Progress stabilization, stretching  program.  next visit add heelslide, press up. prone single leg, single arm and opposite arm leg raise progressing to standing stabilization and educating how to incorporate these into functional tasks ie lunging = vacuuming, squatting= getting things out of lower cabinets/dryer...    PT Home Exercise Plan 7/15:  hamstirng stretch, SKTC,  stand extension, POE  7/20:  seated piriformis, bridge             Patient will benefit from skilled therapeutic intervention in order to improve the following deficits and impairments:  Decreased activity tolerance, Decreased balance, Decreased mobility, Decreased strength, Difficulty walking, Pain  Visit Diagnosis: Radiculopathy, lumbar region  Muscle weakness  (generalized)     Problem List Patient Active Problem List   Diagnosis Date Noted   Special screening for malignant neoplasms, colon 09/16/2017   Abdominal pain, left lower quadrant 07/06/2012   Depression 07/06/2012   Lupus ([place]) 07/06/2012   Chronic pain syndrome 07/06/2012   FHx: migraine headaches 07/06/2012   KNEE PAIN 03/16/2007   BACK PAIN 03/16/2007   FIBROMYALGIA/FIBROMYOSITIS 03/16/2007   Teena Irani, PTA/CLT [phone]  Teena Irani 10/30/2020, 4:50 PM  [place], [place], [address] Phone: [phone]   Fax:  [phone]  Name: [name] MRN: [id number] Date of Birth: 1961-01-12

## 2020-11-05 ENCOUNTER — Other Ambulatory Visit: Payer: Self-pay

## 2020-11-05 ENCOUNTER — Ambulatory Visit (HOSPITAL_COMMUNITY): Payer: 59 | Admitting: Physical Therapy

## 2020-11-05 DIAGNOSIS — M5416 Radiculopathy, lumbar region: Secondary | ICD-10-CM | POA: Diagnosis not present

## 2020-11-05 DIAGNOSIS — M6281 Muscle weakness (generalized): Secondary | ICD-10-CM

## 2020-11-05 NOTE — Patient Instructions (Signed)
Straight Leg Raise    Tighten stomach and slowly raise locked right leg __15__ inches from floor. Repeat __10__ times per set. Do __2__ sessions per day.

## 2020-11-05 NOTE — Therapy (Signed)
[place], [place], [address] Phone: [phone]   Fax:  [phone]  Physical Therapy Treatment  Patient Details  Name: [name] MRN: [id number] Date of Birth: 1961/02/08 Referring Provider (PT): Arther Abbott   Encounter Date: 11/05/2020   PT End of Session - 11/05/20 1223     Visit Number 3    Number of Visits 12    Date for PT Re-Evaluation 12/06/20    Authorization Type UHC    Authorization - Visit Number 3    Authorization - Number of Visits 20    Progress Note Due on Visit 10    PT Start Time [id number]    PT Stop Time 1222    PT Time Calculation (min) 46 min    Activity Tolerance Patient tolerated treatment well    Behavior During Therapy [facility] for tasks assessed/performed             Past Medical History:  Diagnosis Date   Anemia    2002 after having hysterectomy   Anxiety    Chronic pain    Depression    Fibromyalgia    GERD (gastroesophageal reflux disease)    History of kidney stones    Lupus (HCC)    Migraine headache    PONV (postoperative nausea and vomiting)    Sleep apnea    "Not enough for a CPAP".    Past Surgical History:  Procedure Laterality Date   APPENDECTOMY     BREAST CYST ASPIRATION Left    CARDIAC CATHETERIZATION     x 2 and both were normal   CHOLECYSTECTOMY     COLONOSCOPY WITH PROPOFOL N/A 11/05/2017   Procedure: COLONOSCOPY WITH PROPOFOL;  Surgeon: Rogene Houston, MD;  [facility];  Service: Endoscopy;  Laterality: N/A;  8:30   complete hysterectomy     POLYPECTOMY  11/05/2017   Procedure: POLYPECTOMY;  Surgeon: Rogene Houston, MD;  [facility];  Service: Endoscopy;;  sigmoid   TONSILLECTOMY     TUBAL LIGATION      There were no vitals filed for this visit.   Subjective Assessment - 11/05/20 1142     Subjective PT states she had stabbing pain on sunday from being up alot on Saturday and had the "crampy" pains that would  come and go down the back of Lt LE yesterday.  A little "pulsating" pains otw here today but otherwise okay today.    Currently in Pain? No/denies                               [facility] Adult PT Treatment/Exercise - 11/05/20 0001       Lumbar Exercises: Stretches   Active Hamstring Stretch Left;3 reps;30 seconds    Single Knee to Chest Stretch Left;1 rep;60 seconds    Standing Extension 10 reps    Piriformis Stretch Right;Left;2 reps;30 seconds    Piriformis Stretch Limitations HEP review    Other Lumbar Stretch Exercise hip excursions 10X each way      Lumbar Exercises: Seated   Sit to Stand 10 reps    Sit to Stand Limitations no UE's      Lumbar Exercises: Supine   Bridge 15 reps    Straight Leg Raise 15 reps      Lumbar Exercises: Sidelying   Hip Abduction 15 reps  PT Short Term Goals - 10/25/20 1018       PT SHORT TERM GOAL #1   Title Pt to be completing a HEP to decrease nerve irritation so that pain is only to mid thigh area.    Time 3    Period Weeks    Status New    Target Date 11/15/20      PT SHORT TERM GOAL #2   Title Pt to only be waking from sleep 1x a night    Time 3    Period Weeks    Status New      PT SHORT TERM GOAL #3   Title PT core and LE strength increased 1/2 grade to allow pt to get up from lower couches without increased pain    Time 3    Period Weeks    Status New               PT Long Term Goals - 10/25/20 1020       PT LONG TERM GOAL #1   Title Pt to be I in an advance HEP to allow pt to have had no radicular sx in her lt LE in the past week    Time 6    Period Weeks    Status New    Target Date 12/06/20      PT LONG TERM GOAL #2   Title PT to increast LE strength to allow pt to be able to squat and return from a squat without difficulty for house and yard work.    Time 6    Period Weeks    Status New      PT LONG TERM GOAL #3   Title Pt core strength to be increased  to allow pt to be able to single leg stance for 50" on both LE    Time 6    Period Weeks    Status New      PT LONG TERM GOAL #4   Title PT to be able to verbalize the importance of good posture and proper body mechanics in maintaining a healthy back.    Time 6    Period Weeks    Status New                   Plan - 11/05/20 1224     Clinical Impression Statement Reviewed HEP with ability to complete independently.   Progressed with straight leg raises while maintaining core stab and given for HEP.  Pt required minimal cues during session for form, however requires redirection to keep count of exercises.  Pt pleased with progress and reports no pain or issues during or at the end of session today.    Personal Factors and Comorbidities Time since onset of injury/illness/exacerbation    Examination-Activity Limitations Bend;Carry;Lift;Locomotion Level;Stand;Squat    Examination-Participation Restrictions Cleaning;Community Activity;Driving;Laundry;Shop    Stability/Clinical Decision Making Stable/Uncomplicated    Rehab Potential Good    PT Frequency 2x / week    PT Duration 6 weeks    PT Treatment/Interventions Patient/family education;Therapeutic activities;Therapeutic exercise;Manual techniques    PT Next Visit Plan Progress stabilization, stretching  program.  next visit add press up. prone single leg, single arm and opposite arm leg raise progressing to standing stabilization and educating how to incorporate these into functional tasks ie lunging = vacuuming, squatting= getting things out of lower cabinets/dryer...    PT Home Exercise Plan 7/15:  hamstirng stretch, SKTC, stand extension, POE  7/20:  seated piriformis, bridge  7/26: SLR             Patient will benefit from skilled therapeutic intervention in order to improve the following deficits and impairments:  Decreased activity tolerance, Decreased balance, Decreased mobility, Decreased strength, Difficulty walking,  Pain  Visit Diagnosis: Radiculopathy, lumbar region  Muscle weakness (generalized)     Problem List Patient Active Problem List   Diagnosis Date Noted   Special screening for malignant neoplasms, colon 09/16/2017   Abdominal pain, left lower quadrant 07/06/2012   Depression 07/06/2012   Lupus ([place]) 07/06/2012   Chronic pain syndrome 07/06/2012   FHx: migraine headaches 07/06/2012   KNEE PAIN 03/16/2007   BACK PAIN 03/16/2007   FIBROMYALGIA/FIBROMYOSITIS 03/16/2007   Teena Irani, PTA/CLT [phone]  Teena Irani 11/05/2020, 12:25 PM  [facility] [address] [place], [place], [address] Phone: [phone]   Fax:  [phone]  Name: [name] MRN: [id number] Date of Birth: 04/18/1960

## 2020-11-13 ENCOUNTER — Ambulatory Visit (HOSPITAL_COMMUNITY): Payer: 59 | Attending: Orthopedic Surgery | Admitting: Physical Therapy

## 2020-11-13 ENCOUNTER — Other Ambulatory Visit: Payer: Self-pay

## 2020-11-13 DIAGNOSIS — M5416 Radiculopathy, lumbar region: Secondary | ICD-10-CM | POA: Insufficient documentation

## 2020-11-13 DIAGNOSIS — M6281 Muscle weakness (generalized): Secondary | ICD-10-CM | POA: Insufficient documentation

## 2020-11-13 NOTE — Patient Instructions (Signed)
Piriformis Stretch, Sitting    Sit, one ankle on opposite knee, same-side hand on crossed knee. Push down on knee, keeping spine straight. Lean torso forward, with flat back, until tension is felt in hamstrings and gluteals of crossed-leg side. Hold _30__ seconds. Repeat _3__ times per session. Do __2_ sessions per day.   Knee to Chest    Lying supine, bend involved knee to chest _5__ times. :Hold_30_-seconds. Repeat with other leg.Do _2__ times per day.  Straight Leg Raise    Tighten stomach and slowly raise leg.  Repeat _10__ times per set.  Do _2___ sessions per day.    Bridge    Lie back, legs bent. Inhale, pressing hips up. Keeping ribs in, lengthen lower back. Exhale, rolling down along spine from top. Repeat __10__ times. Do _2__ sessions per day.

## 2020-11-13 NOTE — Therapy (Signed)
[name], [place], [address] Phone: [phone]   Fax:  [phone]  Physical Therapy Treatment  Patient Details  Name: [name] MRN: [id number] Date of Birth: [birth year] Referring Provider (PT): Arther Abbott   Encounter Date: 11/13/2020   PT End of Session - 11/13/20 1021     Visit Number 4    Number of Visits 12    Date for PT Re-Evaluation 12/06/20    Authorization Type UHC    Authorization - Visit Number 4    Authorization - Number of Visits 20    Progress Note Due on Visit 10    PT Start Time 0920    PT Stop Time 1010    PT Time Calculation (min) 50 min    Activity Tolerance Patient tolerated treatment well    Behavior During Therapy [facility] for tasks assessed/performed             Past Medical History:  Diagnosis Date   Anemia    2002 after having hysterectomy   Anxiety    Chronic pain    Depression    Fibromyalgia    GERD (gastroesophageal reflux disease)    History of kidney stones    Lupus (HCC)    Migraine headache    PONV (postoperative nausea and vomiting)    Sleep apnea    "Not enough for a CPAP".    Past Surgical History:  Procedure Laterality Date   APPENDECTOMY     BREAST CYST ASPIRATION Left    CARDIAC CATHETERIZATION     x 2 and both were normal   CHOLECYSTECTOMY     COLONOSCOPY WITH PROPOFOL N/A 11/05/2017   Procedure: COLONOSCOPY WITH PROPOFOL;  Surgeon: Rogene Houston, MD;  [facility];  Service: Endoscopy;  Laterality: N/A;  8:30   complete hysterectomy     POLYPECTOMY  11/05/2017   Procedure: POLYPECTOMY;  Surgeon: Rogene Houston, MD;  [facility];  Service: Endoscopy;;  sigmoid   TONSILLECTOMY     TUBAL LIGATION      There were no vitals filed for this visit.   Subjective Assessment - 11/13/20 1023     Subjective pt states her Rt lateral leg is really bothering her today.  Reports she has misplaced her HEP.    Currently in  Pain? Yes    Pain Score 5     Pain Location Leg    Pain Orientation Right;Lateral    Pain Descriptors / Indicators Aching;Radiating    Pain Type Acute pain                               [facility] Adult PT Treatment/Exercise - 11/13/20 0001       Lumbar Exercises: Stretches   Active Hamstring Stretch Left;3 reps;30 seconds    Active Hamstring Stretch Limitations shown with dog leash    Standing Extension 10 reps    ITB Stretch Right    ITB Stretch Limitations shown in standing, sidelying and supine with rope      Lumbar Exercises: Supine   Bridge 15 reps    Straight Leg Raise 15 reps      Lumbar Exercises: Sidelying   Hip Abduction 15 reps      Lumbar Exercises: Prone   Single Arm Raise 5 reps    Straight Leg Raise 5 reps    Opposite Arm/Leg Raise 5 reps  Other Prone Lumbar Exercises POE 2 minutes      Manual Therapy   Manual Therapy Soft tissue mobilization    Manual therapy comments to Rt ITB in Lt sidelying    Soft tissue mobilization to reduce tightness/adhesions                      PT Short Term Goals - 10/25/20 1018       PT SHORT TERM GOAL #1   Title Pt to be completing a HEP to decrease nerve irritation so that pain is only to mid thigh area.    Time 3    Period Weeks    Status New    Target Date 11/15/20      PT SHORT TERM GOAL #2   Title Pt to only be waking from sleep 1x a night    Time 3    Period Weeks    Status New      PT SHORT TERM GOAL #3   Title PT core and LE strength increased 1/2 grade to allow pt to get up from lower couches without increased pain    Time 3    Period Weeks    Status New               PT Long Term Goals - 10/25/20 1020       PT LONG TERM GOAL #1   Title Pt to be I in an advance HEP to allow pt to have had no radicular sx in her lt LE in the past week    Time 6    Period Weeks    Status New    Target Date 12/06/20      PT LONG TERM GOAL #2   Title PT to increast LE  strength to allow pt to be able to squat and return from a squat without difficulty for house and yard work.    Time 6    Period Weeks    Status New      PT LONG TERM GOAL #3   Title Pt core strength to be increased to allow pt to be able to single leg stance for 50" on both LE    Time 6    Period Weeks    Status New      PT LONG TERM GOAL #4   Title PT to be able to verbalize the importance of good posture and proper body mechanics in maintaining a healthy back.    Time 6    Period Weeks    Status New                   Plan - 11/13/20 1021     Clinical Impression Statement Pt reports she has misplaced her HEP; new copies given in folder for her to keep at home. Pt with increased ITB tightness and discomfort.  Instructed with ITB stretch in all positions with best felt sidelying hanging backward off mat.  Soft tissue techniques completed to Rt ITB including manual and rolling to reduce tightness.   Progressed with prone exercises with cues for core stabilization.    Personal Factors and Comorbidities Time since onset of injury/illness/exacerbation    Examination-Activity Limitations Bend;Carry;Lift;Locomotion Level;Stand;Squat    Examination-Participation Restrictions Cleaning;Community Activity;Driving;Laundry;Shop    Stability/Clinical Decision Making Stable/Uncomplicated    Rehab Potential Good    PT Frequency 2x / week    PT Duration 6 weeks    PT Treatment/Interventions Patient/family education;Therapeutic activities;Therapeutic  exercise;Manual techniques    PT Next Visit Plan Progress stabilization, stretching  program.   Progressing to standing stabilization and educating how to incorporate these into functional tasks ie lunging = vacuuming, squatting= getting things out of lower cabinets/dryer...    PT Home Exercise Plan 7/15:  hamstirng stretch, SKTC, stand extension, POE  7/20:  seated piriformis, bridge  7/26: SLR             Patient will benefit from  skilled therapeutic intervention in order to improve the following deficits and impairments:  Decreased activity tolerance, Decreased balance, Decreased mobility, Decreased strength, Difficulty walking, Pain  Visit Diagnosis: Muscle weakness (generalized)  Radiculopathy, lumbar region     Problem List Patient Active Problem List   Diagnosis Date Noted   Special screening for malignant neoplasms, colon 09/16/2017   Abdominal pain, left lower quadrant 07/06/2012   Depression 07/06/2012   Lupus ([place]) 07/06/2012   Chronic pain syndrome 07/06/2012   FHx: migraine headaches 07/06/2012   KNEE PAIN 03/16/2007   BACK PAIN 03/16/2007   FIBROMYALGIA/FIBROMYOSITIS 03/16/2007   Teena Irani, PTA/CLT [phone]  Teena Irani 11/13/2020, 10:24 AM  [place], [place], [address] Phone: [phone]   Fax:  [phone]  Name: KORTNY SULEMAN MRN: [id number] Date of Birth: 1960/11/10

## 2020-11-15 ENCOUNTER — Ambulatory Visit (HOSPITAL_COMMUNITY): Payer: 59

## 2020-11-15 ENCOUNTER — Other Ambulatory Visit: Payer: Self-pay

## 2020-11-15 DIAGNOSIS — M5416 Radiculopathy, lumbar region: Secondary | ICD-10-CM

## 2020-11-15 DIAGNOSIS — M6281 Muscle weakness (generalized): Secondary | ICD-10-CM

## 2020-11-15 NOTE — Patient Instructions (Signed)
Access Code: [email or web address] URL: [email or web address] Date: 11/15/2020 Prepared by: Sherlyn Lees  Exercises Abdominal Bracing - 1 x daily - 7 x weekly - 3 sets - 10 reps - 2 sec hold Prone Press Up - 1 x daily - 7 x weekly - 3 sets - 10 reps

## 2020-11-15 NOTE — Therapy (Signed)
[place], [place], [address] Phone: [phone]   Fax:  [phone]  Physical Therapy Treatment  Patient Details  Name: [name] MRN: [id number] Date of Birth: 04-25-1960 Referring Provider (PT): Arther Abbott   Encounter Date: 11/15/2020   PT End of Session - 11/15/20 1056     Visit Number 5    Number of Visits 12    Date for PT Re-Evaluation 12/06/20    Authorization Type UHC    Authorization - Visit Number 5    Authorization - Number of Visits 20    Progress Note Due on Visit 10    PT Start Time 1030    PT Stop Time 1115    PT Time Calculation (min) 45 min    Activity Tolerance Patient tolerated treatment well    Behavior During Therapy [facility] for tasks assessed/performed             Past Medical History:  Diagnosis Date   Anemia    2002 after having hysterectomy   Anxiety    Chronic pain    Depression    Fibromyalgia    GERD (gastroesophageal reflux disease)    History of kidney stones    Lupus (HCC)    Migraine headache    PONV (postoperative nausea and vomiting)    Sleep apnea    "Not enough for a CPAP".    Past Surgical History:  Procedure Laterality Date   APPENDECTOMY     BREAST CYST ASPIRATION Left    CARDIAC CATHETERIZATION     x 2 and both were normal   CHOLECYSTECTOMY     COLONOSCOPY WITH PROPOFOL N/A 11/05/2017   Procedure: COLONOSCOPY WITH PROPOFOL;  Surgeon: Rogene Houston, MD;  [facility];  Service: Endoscopy;  Laterality: N/A;  8:30   complete hysterectomy     POLYPECTOMY  11/05/2017   Procedure: POLYPECTOMY;  Surgeon: Rogene Houston, MD;  [facility];  Service: Endoscopy;;  sigmoid   TONSILLECTOMY     TUBAL LIGATION      There were no vitals filed for this visit.   Subjective Assessment - 11/15/20 1043     Subjective Notes continued left buttocks and left leg pain which is especially noticeable towards the end of the day after  she's been up all day.                [facility] PT Assessment - 11/15/20 0001       Assessment   Medical Diagnosis Lt lumbar radiculopathy    Referring Provider (PT) Arther Abbott                           [facility] Adult PT Treatment/Exercise - 11/15/20 0001       Lumbar Exercises: Stretches   Standing Extension 10 reps    Prone on Elbows Stretch Other (comment)    Prone on Elbows Stretch Limitations 10x5 sec    Press Ups 5 seconds;20 reps      Lumbar Exercises: Supine   Ab Set 20 reps;2 seconds                    PT Education - 11/15/20 1056     Education Details education in lumbar disc mechanics and mechanical dysfunction. Sitting with lumbar support    Person(s) Educated Patient    Methods Explanation    Comprehension Verbalized understanding  PT Short Term Goals - 10/25/20 1018       PT SHORT TERM GOAL #1   Title Pt to be completing a HEP to decrease nerve irritation so that pain is only to mid thigh area.    Time 3    Period Weeks    Status New    Target Date 11/15/20      PT SHORT TERM GOAL #2   Title Pt to only be waking from sleep 1x a night    Time 3    Period Weeks    Status New      PT SHORT TERM GOAL #3   Title PT core and LE strength increased 1/2 grade to allow pt to get up from lower couches without increased pain    Time 3    Period Weeks    Status New               PT Long Term Goals - 10/25/20 1020       PT LONG TERM GOAL #1   Title Pt to be I in an advance HEP to allow pt to have had no radicular sx in her lt LE in the past week    Time 6    Period Weeks    Status New    Target Date 12/06/20      PT LONG TERM GOAL #2   Title PT to increast LE strength to allow pt to be able to squat and return from a squat without difficulty for house and yard work.    Time 6    Period Weeks    Status New      PT LONG TERM GOAL #3   Title Pt core strength to be increased to allow pt to be  able to single leg stance for 50" on both LE    Time 6    Period Weeks    Status New      PT LONG TERM GOAL #4   Title PT to be able to verbalize the importance of good posture and proper body mechanics in maintaining a healthy back.    Time 6    Period Weeks    Status New                   Plan - 11/15/20 1108     Clinical Impression Statement Able to achieve LLE centralization with extension-bias positions/movements. Tactile/verbal cues for core engagement during stabilization exercises. Continued sessions indicated to centralize LLE pain and progress to lumbar/core strength and functional lifts    Personal Factors and Comorbidities Time since onset of injury/illness/exacerbation    Examination-Activity Limitations Bend;Carry;Lift;Locomotion Level;Stand;Squat    Examination-Participation Restrictions Cleaning;Community Activity;Driving;Laundry;Shop    Stability/Clinical Decision Making Stable/Uncomplicated    Rehab Potential Good    PT Frequency 2x / week    PT Duration 6 weeks    PT Treatment/Interventions Patient/family education;Therapeutic activities;Therapeutic exercise;Manual techniques    PT Next Visit Plan Progress stabilization, stretching  program.   Progressing to standing stabilization and educating how to incorporate these into functional tasks ie lunging = vacuuming, squatting= getting things out of lower cabinets/dryer...    PT Home Exercise Plan 7/15:  hamstirng stretch, SKTC, stand extension, POE  7/20:  seated piriformis, bridge  7/26: SLR             Patient will benefit from skilled therapeutic intervention in order to improve the following deficits and impairments:  Decreased activity tolerance, Decreased balance, Decreased  mobility, Decreased strength, Difficulty walking, Pain  Visit Diagnosis: Muscle weakness (generalized)  Radiculopathy, lumbar region     Problem List Patient Active Problem List   Diagnosis Date Noted   Special  screening for malignant neoplasms, colon 09/16/2017   Abdominal pain, left lower quadrant 07/06/2012   Depression 07/06/2012   Lupus ([place]) 07/06/2012   Chronic pain syndrome 07/06/2012   FHx: migraine headaches 07/06/2012   KNEE PAIN 03/16/2007   BACK PAIN 03/16/2007   FIBROMYALGIA/FIBROMYOSITIS 03/16/2007   11:29 AM, 11/15/20 M. Sherlyn Lees, PT, DPT Physical Therapist- [place] Office Number: [phone]   [facility] [address] [place], [place], [address] Phone: [phone]   Fax:  [phone]  Name: [name] MRN: [id number] Date of Birth: 1961-01-18

## 2020-11-19 ENCOUNTER — Ambulatory Visit (HOSPITAL_COMMUNITY): Payer: 59 | Admitting: Physical Therapy

## 2020-11-19 ENCOUNTER — Other Ambulatory Visit: Payer: Self-pay

## 2020-11-19 DIAGNOSIS — M6281 Muscle weakness (generalized): Secondary | ICD-10-CM

## 2020-11-19 DIAGNOSIS — M5416 Radiculopathy, lumbar region: Secondary | ICD-10-CM

## 2020-11-19 NOTE — Therapy (Signed)
[place], [place], [address] Phone: [phone]   Fax:  [phone]  Physical Therapy Treatment  Patient Details  Name: [name] MRN: [id number] Date of Birth: 20-Jun-1960 Referring Provider (PT): Arther Abbott   Encounter Date: 11/19/2020   PT End of Session - 11/19/20 0939     Visit Number 6    Number of Visits 12    Date for PT Re-Evaluation 12/06/20    Authorization Type UHC    Authorization - Visit Number 6    Authorization - Number of Visits 20    Progress Note Due on Visit 10    PT Start Time 0915    PT Stop Time 0955    PT Time Calculation (min) 40 min    Activity Tolerance Patient tolerated treatment well    Behavior During Therapy [facility] for tasks assessed/performed             Past Medical History:  Diagnosis Date   Anemia    2002 after having hysterectomy   Anxiety    Chronic pain    Depression    Fibromyalgia    GERD (gastroesophageal reflux disease)    History of kidney stones    Lupus (HCC)    Migraine headache    PONV (postoperative nausea and vomiting)    Sleep apnea    "Not enough for a CPAP".    Past Surgical History:  Procedure Laterality Date   APPENDECTOMY     BREAST CYST ASPIRATION Left    CARDIAC CATHETERIZATION     x 2 and both were normal   CHOLECYSTECTOMY     COLONOSCOPY WITH PROPOFOL N/A 11/05/2017   Procedure: COLONOSCOPY WITH PROPOFOL;  Surgeon: Rogene Houston, MD;  [facility];  Service: Endoscopy;  Laterality: N/A;  8:30   complete hysterectomy     POLYPECTOMY  11/05/2017   Procedure: POLYPECTOMY;  Surgeon: Rogene Houston, MD;  [facility];  Service: Endoscopy;;  sigmoid   TONSILLECTOMY     TUBAL LIGATION      There were no vitals filed for this visit.   Subjective Assessment - 11/19/20 0919     Subjective Pt states that she has been doing her exercisesl    Currently in Pain? Yes    Pain Score 2     Pain Location  Buttocks    Pain Orientation Left    Pain Descriptors / Indicators Aching    Pain Type Acute pain    Pain Onset More than a month ago    Pain Frequency Intermittent    Aggravating Factors  sitting    Pain Relieving Factors steriod    Effect of Pain on Daily Activities limies                               [facility] Adult PT Treatment/Exercise - 11/19/20 0001       Exercises   Exercises Lumbar      Lumbar Exercises: Stretches   Active Hamstring Stretch Left;3 reps;30 seconds    Standing Extension 10 reps    Prone on Elbows Stretch Other (comment)    Press Ups 5 seconds;20 reps    Piriformis Stretch Left;Right;1 rep;60 seconds    Piriformis Stretch Limitations quadriped      Lumbar Exercises: Standing   Heel Raises 10 reps    Functional Squats 10 reps  Lumbar Exercises: Supine   Bridge with Ball Squeeze 10 reps      Lumbar Exercises: Sidelying   Clam Both;10 reps    Hip Abduction 15 reps      Lumbar Exercises: Prone   Opposite Arm/Leg Raise 10 reps                      PT Short Term Goals - 10/25/20 1018       PT SHORT TERM GOAL #1   Title Pt to be completing a HEP to decrease nerve irritation so that pain is only to mid thigh area.    Time 3    Period Weeks    Status New    Target Date 11/15/20      PT SHORT TERM GOAL #2   Title Pt to only be waking from sleep 1x a night    Time 3    Period Weeks    Status New      PT SHORT TERM GOAL #3   Title PT core and LE strength increased 1/2 grade to allow pt to get up from lower couches without increased pain    Time 3    Period Weeks    Status New               PT Long Term Goals - 10/25/20 1020       PT LONG TERM GOAL #1   Title Pt to be I in an advance HEP to allow pt to have had no radicular sx in her lt LE in the past week    Time 6    Period Weeks    Status New    Target Date 12/06/20      PT LONG TERM GOAL #2   Title PT to increast LE strength to allow pt to  be able to squat and return from a squat without difficulty for house and yard work.    Time 6    Period Weeks    Status New      PT LONG TERM GOAL #3   Title Pt core strength to be increased to allow pt to be able to single leg stance for 50" on both LE    Time 6    Period Weeks    Status New      PT LONG TERM GOAL #4   Title PT to be able to verbalize the importance of good posture and proper body mechanics in maintaining a healthy back.    Time 6    Period Weeks    Status New                   Plan - 11/19/20 [id number]     Clinical Impression Statement Pt completing exercises and buttock pain has decreased to a 1-2 .  Added all four piriformis stretch, attempted to add all four opposite arm/leg raise however pt was not able to obtain good form therefore returned to prone with pillow under abdomen.    Personal Factors and Comorbidities Time since onset of injury/illness/exacerbation    Examination-Activity Limitations Bend;Carry;Lift;Locomotion Level;Stand;Squat    Examination-Participation Restrictions Cleaning;Community Activity;Driving;Laundry;Shop    Stability/Clinical Decision Making Stable/Uncomplicated    Rehab Potential Good    PT Frequency 2x / week    PT Duration 6 weeks    PT Treatment/Interventions Patient/family education;Therapeutic activities;Therapeutic exercise;Manual techniques    PT Next Visit Plan Progress stabilization, stretching  program.   Progressing to standing stabilization  and educating how to incorporate these into functional tasks ie lunging = vacuuming, squatting= getting things out of lower cabinets/dryer...    PT Home Exercise Plan 7/15:  hamstirng stretch, SKTC, stand extension, POE  7/20:  seated piriformis, bridge  7/26: SLR; 8/9:  press up, all four piriformis , prone opposite arm/leg ;bridge with adduciton.             Patient will benefit from skilled therapeutic intervention in order to improve the following deficits and  impairments:  Decreased activity tolerance, Decreased balance, Decreased mobility, Decreased strength, Difficulty walking, Pain  Visit Diagnosis: Muscle weakness (generalized)  Radiculopathy, lumbar region     Problem List Patient Active Problem List   Diagnosis Date Noted   Special screening for malignant neoplasms, colon 09/16/2017   Abdominal pain, left lower quadrant 07/06/2012   Depression 07/06/2012   Lupus ([place]) 07/06/2012   Chronic pain syndrome 07/06/2012   FHx: migraine headaches 07/06/2012   KNEE PAIN 03/16/2007   BACK PAIN 03/16/2007   FIBROMYALGIA/FIBROMYOSITIS 03/16/2007  Rayetta Humphrey, PT CLT [phone]  11/19/2020, 10:01 AM  [place], [place], [address] Phone: [phone]   Fax:  [phone]  Name: [name] MRN: [id number] Date of Birth: 01-26-1961

## 2020-11-21 ENCOUNTER — Ambulatory Visit (HOSPITAL_COMMUNITY): Payer: 59 | Admitting: Physical Therapy

## 2020-11-21 ENCOUNTER — Other Ambulatory Visit: Payer: Self-pay

## 2020-11-21 DIAGNOSIS — M6281 Muscle weakness (generalized): Secondary | ICD-10-CM

## 2020-11-21 DIAGNOSIS — M5416 Radiculopathy, lumbar region: Secondary | ICD-10-CM

## 2020-11-21 NOTE — Therapy (Addendum)
[facility] [place], [place], [address] Phone: [phone]   Fax:  [phone]  Physical Therapy Treatment  Patient Details  Name: [name] MRN: [id number] Date of Birth: July 29, 1960 Referring Provider (PT): Arther Abbott   Encounter Date: 11/21/2020   PT End of Session - 11/21/20 0945       Visit Number 7    Number of Visits 12     Date for PT Re-Evaluation 12/06/20     Authorization Type UHC     Authorization - Visit Number 7    Authorization - Number of Visits 20     Progress Note Due on Visit 10     PT Start Time 0917    PT Stop Time 1000    PT Time Calculation (min) 43 min     Activity Tolerance Patient tolerated treatment well     Behavior During Therapy [facility] for tasks assessed/performed     Past Medical History:  Diagnosis Date   Anemia    2002 after having hysterectomy   Anxiety    Chronic pain    Depression    Fibromyalgia    GERD (gastroesophageal reflux disease)    History of kidney stones    Lupus (HCC)    Migraine headache    PONV (postoperative nausea and vomiting)    Sleep apnea    "Not enough for a CPAP".    Past Surgical History:  Procedure Laterality Date   APPENDECTOMY     BREAST CYST ASPIRATION Left    CARDIAC CATHETERIZATION     x 2 and both were normal   CHOLECYSTECTOMY     COLONOSCOPY WITH PROPOFOL N/A 11/05/2017   Procedure: COLONOSCOPY WITH PROPOFOL;  Surgeon: Rogene Houston, MD;  [facility];  Service: Endoscopy;  Laterality: N/A;  8:30   complete hysterectomy     POLYPECTOMY  11/05/2017   Procedure: POLYPECTOMY;  Surgeon: Rogene Houston, MD;  [facility];  Service: Endoscopy;;  sigmoid   TONSILLECTOMY     TUBAL LIGATION      There were no vitals filed for this visit.   Subjective Assessment - 11/21/20 0921     Subjective Pt states she done too much helping a friend yesterday cleaning under her beds and her showers.  STates she had no  difficulty doing these things but is sore today in her lumbar and knee areas    Currently in Pain? Yes    Pain Score 3     Pain Location Back    Pain Orientation Left    Pain Descriptors / Indicators Aching                               [facility] Adult PT Treatment/Exercise - 11/21/20 0001       Lumbar Exercises: Stretches   Active Hamstring Stretch Left;3 reps;30 seconds    Standing Extension 10 reps      Lumbar Exercises: Standing   Heel Raises 15 reps    Functional Squats 15 reps    Other Standing Lumbar Exercises body mechanics with household duties.      Lumbar Exercises: Supine   Bridge 20 reps    Bridge Limitations with green physioball, 2 sets of 10 reps    Straight Leg Raise 15 reps      Lumbar Exercises: Sidelying   Clam Both;10 reps    Hip Abduction 15  reps                      PT Short Term Goals - 10/25/20 1018       PT SHORT TERM GOAL #1   Title Pt to be completing a HEP to decrease nerve irritation so that pain is only to mid thigh area.    Time 3    Period Weeks    Status New    Target Date 11/15/20      PT SHORT TERM GOAL #2   Title Pt to only be waking from sleep 1x a night    Time 3    Period Weeks    Status New      PT SHORT TERM GOAL #3   Title PT core and LE strength increased 1/2 grade to allow pt to get up from lower couches without increased pain    Time 3    Period Weeks    Status New               PT Long Term Goals - 10/25/20 1020       PT LONG TERM GOAL #1   Title Pt to be I in an advance HEP to allow pt to have had no radicular sx in her lt LE in the past week    Time 6    Period Weeks    Status New    Target Date 12/06/20      PT LONG TERM GOAL #2   Title PT to increast LE strength to allow pt to be able to squat and return from a squat without difficulty for house and yard work.    Time 6    Period Weeks    Status New      PT LONG TERM GOAL #3   Title Pt core strength to be  increased to allow pt to be able to single leg stance for 50" on both LE    Time 6    Period Weeks    Status New      PT LONG TERM GOAL #4   Title PT to be able to verbalize the importance of good posture and proper body mechanics in maintaining a healthy back.    Time 6    Period Weeks    Status New                   Plan - 11/21/20 1311     Clinical Impression Statement Continued with lumbar stabilization with progression to bridge on physioball with noted challenge/cramping into hamstrings.  Discussed, demonstrated and instructed with body mechanics while completing housework, i.e. vacuuming/mopping/getting items out of trunk.  Pt able to demonstrate appropriately.  Pt compliant with HEP and getting stronger.  No issues or pain with remaining exercises.    Personal Factors and Comorbidities Time since onset of injury/illness/exacerbation    Examination-Activity Limitations Bend;Carry;Lift;Locomotion Level;Stand;Squat    Examination-Participation Restrictions Cleaning;Community Activity;Driving;Laundry;Shop    Stability/Clinical Decision Making Stable/Uncomplicated    Rehab Potential Good    PT Frequency 2x / week    PT Duration 6 weeks    PT Treatment/Interventions Patient/family education;Therapeutic activities;Therapeutic exercise;Manual techniques    PT Next Visit Plan Progress stabilization and stretching program.    PT Home Exercise Plan 7/15:  hamstirng stretch, SKTC, stand extension, POE  7/20:  seated piriformis, bridge  7/26: SLR; 8/9:  press up, all four piriformis , prone opposite arm/leg ;bridge with adduciton.  Patient will benefit from skilled therapeutic intervention in order to improve the following deficits and impairments:  Decreased activity tolerance, Decreased balance, Decreased mobility, Decreased strength, Difficulty walking, Pain  Visit Diagnosis: Muscle weakness (generalized)  Radiculopathy, lumbar region     Problem  List Patient Active Problem List   Diagnosis Date Noted   Special screening for malignant neoplasms, colon 09/16/2017   Abdominal pain, left lower quadrant 07/06/2012   Depression 07/06/2012   Lupus ([place]) 07/06/2012   Chronic pain syndrome 07/06/2012   FHx: migraine headaches 07/06/2012   KNEE PAIN 03/16/2007   BACK PAIN 03/16/2007   FIBROMYALGIA/FIBROMYOSITIS 03/16/2007   Teena Irani, PTA/CLT [phone]  Teena Irani 11/21/2020, 1:14 PM  [place], [place], [address] Phone: [phone]   Fax:  [phone]  Name: [name] MRN: [id number] Date of Birth: 04/26/1960

## 2020-11-26 ENCOUNTER — Encounter (HOSPITAL_COMMUNITY): Payer: Self-pay | Admitting: Physical Therapy

## 2020-11-26 ENCOUNTER — Ambulatory Visit (HOSPITAL_COMMUNITY): Payer: 59 | Admitting: Physical Therapy

## 2020-11-26 ENCOUNTER — Other Ambulatory Visit: Payer: Self-pay

## 2020-11-26 DIAGNOSIS — M5416 Radiculopathy, lumbar region: Secondary | ICD-10-CM

## 2020-11-26 DIAGNOSIS — M6281 Muscle weakness (generalized): Secondary | ICD-10-CM | POA: Diagnosis not present

## 2020-11-26 NOTE — Therapy (Signed)
[place], [place], [address] Phone: [phone]   Fax:  [phone]  Physical Therapy Treatment  Patient Details  Name: [name] MRN: [id number] Date of Birth: Jan 04, 1961 Referring Provider (PT): Arther Abbott   Encounter Date: 11/26/2020   PT End of Session - 11/26/20 0916     Visit Number 7    Number of Visits 12    Date for PT Re-Evaluation 12/06/20    Authorization Type UHC    Authorization - Visit Number 7    Authorization - Number of Visits 20    Progress Note Due on Visit 10    PT Start Time 0916    PT Stop Time 0955    PT Time Calculation (min) 39 min    Activity Tolerance Patient tolerated treatment well    Behavior During Therapy [facility] for tasks assessed/performed             Past Medical History:  Diagnosis Date   Anemia    2002 after having hysterectomy   Anxiety    Chronic pain    Depression    Fibromyalgia    GERD (gastroesophageal reflux disease)    History of kidney stones    Lupus (HCC)    Migraine headache    PONV (postoperative nausea and vomiting)    Sleep apnea    "Not enough for a CPAP".    Past Surgical History:  Procedure Laterality Date   APPENDECTOMY     BREAST CYST ASPIRATION Left    CARDIAC CATHETERIZATION     x 2 and both were normal   CHOLECYSTECTOMY     COLONOSCOPY WITH PROPOFOL N/A 11/05/2017   Procedure: COLONOSCOPY WITH PROPOFOL;  Surgeon: Rogene Houston, MD;  [facility];  Service: Endoscopy;  Laterality: N/A;  8:30   complete hysterectomy     POLYPECTOMY  11/05/2017   Procedure: POLYPECTOMY;  Surgeon: Rogene Houston, MD;  [facility];  Service: Endoscopy;;  sigmoid   TONSILLECTOMY     TUBAL LIGATION      There were no vitals filed for this visit.   Subjective Assessment - 11/26/20 0923     Subjective States that her back was hurting a lot yesterday and doesn't know what caused it. States that she did some of  her stretches and that helped. States current pain is 2/10 in her back and last night it was going into her knee.    Currently in Pain? Yes    Pain Score 2     Pain Location Back    Pain Orientation Left    Pain Descriptors / Indicators Aching                [facility] PT Assessment - 11/26/20 0001       Assessment   Medical Diagnosis Lt lumbar radiculopathy    Referring Provider (PT) Arther Abbott                           [facility] Adult PT Treatment/Exercise - 11/26/20 0001       Lumbar Exercises: Stretches   Press Ups 10 seconds;20 reps      Lumbar Exercises: Quadruped   Other Quadruped Lumbar Exercises child's pose <-> cobra -side child's pose. plank lower to floor; extended moutain, forward fold, halfway lift - 30 minutes total - prior demo then patient demonstrated with verbal cues for form  PT Education - 11/26/20 1000     Education Details on strategies for increasing daily routing habit, on yoga form and types.    Person(s) Educated Patient    Methods Explanation    Comprehension Verbalized understanding              PT Short Term Goals - 10/25/20 1018       PT SHORT TERM GOAL #1   Title Pt to be completing a HEP to decrease nerve irritation so that pain is only to mid thigh area.    Time 3    Period Weeks    Status New    Target Date 11/15/20      PT SHORT TERM GOAL #2   Title Pt to only be waking from sleep 1x a night    Time 3    Period Weeks    Status New      PT SHORT TERM GOAL #3   Title PT core and LE strength increased 1/2 grade to allow pt to get up from lower couches without increased pain    Time 3    Period Weeks    Status New               PT Long Term Goals - 10/25/20 1020       PT LONG TERM GOAL #1   Title Pt to be I in an advance HEP to allow pt to have had no radicular sx in her lt LE in the past week    Time 6    Period Weeks    Status New    Target Date 12/06/20       PT LONG TERM GOAL #2   Title PT to increast LE strength to allow pt to be able to squat and return from a squat without difficulty for house and yard work.    Time 6    Period Weeks    Status New      PT LONG TERM GOAL #3   Title Pt core strength to be increased to allow pt to be able to single leg stance for 50" on both LE    Time 6    Period Weeks    Status New      PT LONG TERM GOAL #4   Title PT to be able to verbalize the importance of good posture and proper body mechanics in maintaining a healthy back.    Time 6    Period Weeks    Status New                   Plan - 11/26/20 0958     Clinical Impression Statement Educated patient on different yoga moves for morning routine. Cues to focus on soft knees and not hyperextending her knees with movement. Educated patient on focus on 5 minute morning routine and to look at different videos for more flow and step by step direction. Will follow up with patient's challenge of performing daily 5 minute routine in the morning. Patient reported she felt better end of session    Personal Factors and Comorbidities Time since onset of injury/illness/exacerbation    Examination-Activity Limitations Bend;Carry;Lift;Locomotion Level;Stand;Squat    Examination-Participation Restrictions Cleaning;Community Activity;Driving;Laundry;Shop    Stability/Clinical Decision Making Stable/Uncomplicated    Rehab Potential Good    PT Frequency 2x / week    PT Duration 6 weeks    PT Treatment/Interventions Patient/family education;Therapeutic activities;Therapeutic exercise;Manual techniques    PT Next Visit Plan  f/u qwith daily morning routine. Progress stabilization and stretching program.    PT Home Exercise Plan 7/15:  hamstirng stretch, SKTC, stand extension, POE  7/20:  seated piriformis, bridge  7/26: SLR; 8/9:  press up, all four piriformis , prone opposite arm/leg ;bridge with adduciton.; 8/16 yoga routine - extended mountain/forward fold,  half raise, slow lower from plank, child's pose, cobra, spinal twist             Patient will benefit from skilled therapeutic intervention in order to improve the following deficits and impairments:  Decreased activity tolerance, Decreased balance, Decreased mobility, Decreased strength, Difficulty walking, Pain  Visit Diagnosis: Muscle weakness (generalized)  Radiculopathy, lumbar region     Problem List Patient Active Problem List   Diagnosis Date Noted   Special screening for malignant neoplasms, colon 09/16/2017   Abdominal pain, left lower quadrant 07/06/2012   Depression 07/06/2012   Lupus ([place]) 07/06/2012   Chronic pain syndrome 07/06/2012   FHx: migraine headaches 07/06/2012   KNEE PAIN 03/16/2007   BACK PAIN 03/16/2007   FIBROMYALGIA/FIBROMYOSITIS 03/16/2007   10:02 AM, 11/26/20 Jerene Pitch, DPT Physical Therapy with [facility]  [phone] office   [place], [place], [address] Phone: [phone]   Fax:  [phone]  Name: [name] MRN: [id number] Date of Birth: [birth year]

## 2020-11-28 ENCOUNTER — Encounter (HOSPITAL_COMMUNITY): Payer: 59 | Admitting: Physical Therapy

## 2020-12-03 ENCOUNTER — Ambulatory Visit (HOSPITAL_COMMUNITY): Payer: 59 | Admitting: Physical Therapy

## 2020-12-03 ENCOUNTER — Other Ambulatory Visit: Payer: Self-pay

## 2020-12-03 DIAGNOSIS — M6281 Muscle weakness (generalized): Secondary | ICD-10-CM | POA: Diagnosis not present

## 2020-12-03 DIAGNOSIS — M5416 Radiculopathy, lumbar region: Secondary | ICD-10-CM

## 2020-12-03 NOTE — Therapy (Signed)
[place], [place], [address] Phone: [phone]   Fax:  [phone]  Physical Therapy Treatment  Patient Details  Name: [name] MRN: [id number] Date of Birth: Aug 12, 1960 Referring Provider (PT): Arther Abbott   Encounter Date: 12/03/2020   PT End of Session - 12/03/20 0924     Visit Number 8    Number of Visits 12    Date for PT Re-Evaluation 12/06/20    Authorization Type UHC    Authorization - Visit Number 8    Authorization - Number of Visits 20    Progress Note Due on Visit 10    PT Start Time 0923    PT Stop Time 1001    PT Time Calculation (min) 38 min    Activity Tolerance Patient tolerated treatment well    Behavior During Therapy [facility] for tasks assessed/performed             Past Medical History:  Diagnosis Date   Anemia    2002 after having hysterectomy   Anxiety    Chronic pain    Depression    Fibromyalgia    GERD (gastroesophageal reflux disease)    History of kidney stones    Lupus (HCC)    Migraine headache    PONV (postoperative nausea and vomiting)    Sleep apnea    "Not enough for a CPAP".    Past Surgical History:  Procedure Laterality Date   APPENDECTOMY     BREAST CYST ASPIRATION Left    CARDIAC CATHETERIZATION     x 2 and both were normal   CHOLECYSTECTOMY     COLONOSCOPY WITH PROPOFOL N/A 11/05/2017   Procedure: COLONOSCOPY WITH PROPOFOL;  Surgeon: Rogene Houston, MD;  [facility];  Service: Endoscopy;  Laterality: N/A;  8:30   complete hysterectomy     POLYPECTOMY  11/05/2017   Procedure: POLYPECTOMY;  Surgeon: Rogene Houston, MD;  [facility];  Service: Endoscopy;;  sigmoid   TONSILLECTOMY     TUBAL LIGATION      There were no vitals filed for this visit.   Subjective Assessment - 12/03/20 0922     Subjective PT states that her back was flared up yesterday but she did her stretches and it helped.    Pertinent History  chronic low back pain, H/A, fibro, lupus    Limitations Sitting;Standing;Walking;House hold activities;Lifting    How long can you sit comfortably? 20-30 minutes.    How long can you stand comfortably? 20 minutes    How long can you walk comfortably? 20 mintues    Patient Stated Goals to be able to get out of the bed in the morning.    Currently in Pain? No/denies                               [facility] Adult PT Treatment/Exercise - 12/03/20 0001       Exercises   Exercises Lumbar      Lumbar Exercises: Stretches   Active Hamstring Stretch Left;Right;2 reps;30 seconds    Single Knee to Chest Stretch Right;Left;2 reps;20 seconds    Standing Side Bend Left;Right;5 reps    Standing Extension 5 reps    ITB Stretch Right;Left;3 reps;20 seconds    ITB Stretch Limitations shown sidelying    Piriformis Stretch Left;Right;60 seconds;20 seconds;2 reps      Lumbar Exercises: Standing  Functional Squats 15 reps    Lifting --   using big green ball to lift go into extension and B side bend x 5   Scapular Retraction Strengthening;Both;15 reps;Theraband    Theraband Level (Scapular Retraction) Level 2 (Red)    Row Both;Strengthening;15 reps;Theraband    Theraband Level (Row) Level 2 (Red)    Shoulder Extension Strengthening;Both;15 reps    Theraband Level (Shoulder Extension) Level 2 (Red)    Other Standing Lumbar Exercises Paloff in functional squat position    Other Standing Lumbar Exercises single leg stance x 2      Lumbar Exercises: Seated   Long Arc Quad on [place] Strengthening;Both;10 reps    Hip Flexion on [employer] Strengthening;10 reps                      PT Short Term Goals - 12/03/20 0934       PT SHORT TERM GOAL #1   Title Pt to be completing a HEP to decrease nerve irritation so that pain is only to mid thigh area.    Time 3    Period Weeks    Status Achieved    Target Date 11/15/20      PT SHORT TERM GOAL #2   Title Pt to only be waking  from sleep 1x a night    Time 3    Period Weeks    Status On-going      PT SHORT TERM GOAL #3   Title PT core and LE strength increased 1/2 grade to allow pt to get up from lower couches without increased pain    Time 3    Period Weeks    Status On-going               PT Long Term Goals - 12/03/20 0935       PT LONG TERM GOAL #1   Title Pt to be I in an advance HEP to allow pt to have had no radicular sx in her lt LE in the past week    Time 6    Period Weeks    Status On-going      PT LONG TERM GOAL #2   Title PT to increast LE strength to allow pt to be able to squat and return from a squat without difficulty for house and yard work.    Time 6    Period Weeks    Status On-going      PT LONG TERM GOAL #3   Title Pt core strength to be increased to allow pt to be able to single leg stance for 50" on both LE    Time 6    Period Weeks    Status On-going      PT LONG TERM GOAL #4   Title PT to be able to verbalize the importance of good posture and proper body mechanics in maintaining a healthy back.    Time 6    Period Weeks    Status On-going                   Plan - 12/03/20 0932     Clinical Impression Statement Addedd tband and paloff exercise for core and posture strengthening with constant reminders to keep core tight. PT also instructed in ball stabilization exercises.    Personal Factors and Comorbidities Time since onset of injury/illness/exacerbation    Examination-Activity Limitations Bend;Carry;Lift;Locomotion Level;Stand;Squat    Examination-Participation Restrictions Cleaning;Community Activity;Driving;Laundry;Shop  Stability/Clinical Decision Making Stable/Uncomplicated    Rehab Potential Good    PT Frequency 2x / week    PT Duration 6 weeks    PT Treatment/Interventions Patient/family education;Therapeutic activities;Therapeutic exercise;Manual techniques    PT Next Visit Plan f/u qwith daily morning routine. Progress stabilization  and stretching program.    PT Home Exercise Plan 7/15:  hamstirng stretch, SKTC, stand extension, POE  7/20:  seated piriformis, bridge  7/26: SLR; 8/9:  press up, all four piriformis , prone opposite arm/leg ;bridge with adduciton.; 8/16 yoga routine - extended mountain/forward fold, half raise, slow lower from plank, child's pose, cobra, spinal twist             Patient will benefit from skilled therapeutic intervention in order to improve the following deficits and impairments:  Decreased activity tolerance, Decreased balance, Decreased mobility, Decreased strength, Difficulty walking, Pain  Visit Diagnosis: Muscle weakness (generalized)  Radiculopathy, lumbar region     Problem List Patient Active Problem List   Diagnosis Date Noted   Special screening for malignant neoplasms, colon 09/16/2017   Abdominal pain, left lower quadrant 07/06/2012   Depression 07/06/2012   Lupus ([place]) 07/06/2012   Chronic pain syndrome 07/06/2012   FHx: migraine headaches 07/06/2012   KNEE PAIN 03/16/2007   BACK PAIN 03/16/2007   FIBROMYALGIA/FIBROMYOSITIS 03/16/2007   Rayetta Humphrey, PT CLT [phone]  12/03/2020, 10:02 AM  [place], [place], [address] Phone: [phone]   Fax:  [phone]  Name: MELDA SIEBELS MRN: [id number] Date of Birth: 1961/03/29

## 2020-12-05 ENCOUNTER — Ambulatory Visit: Payer: 59

## 2020-12-05 ENCOUNTER — Other Ambulatory Visit: Payer: Self-pay

## 2020-12-05 ENCOUNTER — Telehealth (HOSPITAL_COMMUNITY): Payer: Self-pay

## 2020-12-05 ENCOUNTER — Encounter: Payer: Self-pay | Admitting: Orthopedic Surgery

## 2020-12-05 ENCOUNTER — Encounter (HOSPITAL_COMMUNITY): Payer: 59

## 2020-12-05 ENCOUNTER — Ambulatory Visit: Payer: 59 | Admitting: Orthopedic Surgery

## 2020-12-05 VITALS — BP 137/85 | HR 86 | Ht 63.0 in | Wt 158.0 lb

## 2020-12-05 DIAGNOSIS — M5442 Lumbago with sciatica, left side: Secondary | ICD-10-CM

## 2020-12-05 DIAGNOSIS — G8929 Other chronic pain: Secondary | ICD-10-CM

## 2020-12-05 MED ORDER — HYDROCODONE-ACETAMINOPHEN 5-325 MG PO TABS: 1.0000 | ORAL_TABLET | Freq: Four times a day (QID) | ORAL | 0 refills | Status: DC | PRN

## 2020-12-05 NOTE — Patient Instructions (Addendum)
While we are working on your approval please go ahead and call to schedule your appointment with [facility] in at least one (1) week.   Central Scheduling [phone]   MRI   Take tramadol for pain in the day   And hydrocodone at night   Continue the gabapentin   And take ibuprofen 800 mg or the aleve twice a day

## 2020-12-05 NOTE — Telephone Encounter (Signed)
No show, called and spoke to pt.  Pt with MD apt earlier today and reports of new pain down Rt LE.  MD wishes to stop therapy and complete further testing.  Ihor Austin, LPTA/CLT; Delana Meyer [phone]

## 2020-12-05 NOTE — Progress Notes (Signed)
EVALUATION AND MANAGEMENT   Type of appointment : Follow-up  Assessment and Plan:  Encounter Diagnosis  Name Primary?   Chronic right-sided low back pain with left-sided sciatica Yes    60 year old female had physical therapy prednisone tramadol gabapentin Aleve meloxicam no improvement worsening pain right side radiating to right leg  Recommend MRI lumbar spine   Take tramadol for pain in the day   And hydrocodone at night   Continue the gabapentin   And take ibuprofen 800 mg or the aleve twice a day  Meds ordered this encounter  Medications   HYDROcodone-acetaminophen (NORCO/VICODIN) 5-325 MG tablet    Sig: Take 1 tablet by mouth every 6 (six) hours as needed for moderate pain.    Dispense:  30 tablet    Refill:  0     Chief Complaint  Patient presents with   Knee Pain    Left knee/follow up   Back Pain    Follow up//pt having pain radiating down into right thigh and knee. Pt states it feels likes like the bone is hurting and the pain was radiating towards the inside of the thigh    60 year old female with over 8-week history now of pain rating from her lower back radiating down her right leg initially started off in the left leg and left knee she went to physical therapy she took prednisone tramadol naproxen meloxicam (caused her reaction) and gabapentin  She is very concerned that she is having bone pain  She had an x-ray back in 2015 lumbar spine lumbar curve flattening was noted at that time slight scoliosis which appeared to be reactive   Physical Exam  BP 137/85   Pulse 86   Ht '5\' 3"'$  (1.6 m)   Wt 158 lb (71.7 kg)   BMI 27.99 kg/m   Right sided lumbar tenderness  Normal sensation  Normal reflex  +45 degrees straight leg raise    Past Medical History:  Diagnosis Date   Anemia    2002 after having hysterectomy   Anxiety    Chronic pain    Depression    Fibromyalgia    GERD (gastroesophageal reflux disease)    History of kidney stones     Lupus (HCC)    Migraine headache    PONV (postoperative nausea and vomiting)    Sleep apnea    "Not enough for a CPAP".   Past Surgical History:  Procedure Laterality Date   APPENDECTOMY     BREAST CYST ASPIRATION Left    CARDIAC CATHETERIZATION     x 2 and both were normal   CHOLECYSTECTOMY     COLONOSCOPY WITH PROPOFOL N/A 11/05/2017   Procedure: COLONOSCOPY WITH PROPOFOL;  Surgeon: Rogene Houston, MD;  [facility];  Service: Endoscopy;  Laterality: N/A;  8:30   complete hysterectomy     POLYPECTOMY  11/05/2017   Procedure: POLYPECTOMY;  Surgeon: Rogene Houston, MD;  [facility];  Service: Endoscopy;;  sigmoid   TONSILLECTOMY     TUBAL LIGATION     Social History   Tobacco Use   Smoking status: Never   Smokeless tobacco: Never  Vaping Use   Vaping Use: Never used  Substance Use Topics   Alcohol use: No   Drug use: No

## 2020-12-05 NOTE — Addendum Note (Signed)
Addended by: Elizabeth Sauer on: 12/05/2020 10:15 AM   Modules accepted: Orders

## 2020-12-11 ENCOUNTER — Encounter (HOSPITAL_COMMUNITY): Payer: Self-pay | Admitting: Physical Therapy

## 2020-12-11 NOTE — Therapy (Signed)
[place], [place], [address] Phone: [phone]   Fax:  [phone]  Patient Details  Name: [name] MRN: [id number] Date of Birth: [birth year] Referring Provider:  No ref. provider found  Encounter Date: 12/11/2020   Pt called to state that her MD wants to put therapy on hold until MRI results are back.   Rayetta Humphrey, PT CLT [phone]  12/11/2020, 2:26 PM  [place] [address] [place], [place], [address] Phone: [phone]   Fax:  [phone]

## 2020-12-18 ENCOUNTER — Ambulatory Visit (HOSPITAL_COMMUNITY)
Admission: RE | Admit: 2020-12-18 | Discharge: 2020-12-18 | Disposition: A | Discharge status: Home / Self Care | Payer: 59 | Source: Ambulatory Visit | Attending: Orthopedic Surgery | Admitting: Orthopedic Surgery

## 2020-12-18 ENCOUNTER — Other Ambulatory Visit: Payer: Self-pay

## 2020-12-18 DIAGNOSIS — G8929 Other chronic pain: Secondary | ICD-10-CM | POA: Diagnosis present

## 2020-12-18 DIAGNOSIS — M5442 Lumbago with sciatica, left side: Secondary | ICD-10-CM | POA: Diagnosis not present

## 2021-01-02 ENCOUNTER — Ambulatory Visit: Payer: 59 | Admitting: Orthopedic Surgery

## 2021-01-07 ENCOUNTER — Other Ambulatory Visit: Payer: Self-pay | Admitting: Orthopedic Surgery

## 2021-01-07 DIAGNOSIS — G8929 Other chronic pain: Secondary | ICD-10-CM

## 2021-01-07 DIAGNOSIS — M25462 Effusion, left knee: Secondary | ICD-10-CM

## 2021-01-13 ENCOUNTER — Encounter: Payer: Self-pay | Admitting: Orthopedic Surgery

## 2021-01-13 ENCOUNTER — Ambulatory Visit: Payer: 59 | Admitting: Orthopedic Surgery

## 2021-01-13 ENCOUNTER — Other Ambulatory Visit: Payer: Self-pay

## 2021-01-13 DIAGNOSIS — M5442 Lumbago with sciatica, left side: Secondary | ICD-10-CM

## 2021-01-13 DIAGNOSIS — G8929 Other chronic pain: Secondary | ICD-10-CM | POA: Diagnosis not present

## 2021-01-13 NOTE — Patient Instructions (Signed)
You have been referred to [facility] on [address] in [place], they will call you with appointment. If you have not heard anything in one week call them to schedule [phone]   

## 2021-01-13 NOTE — Progress Notes (Signed)
Chief Complaint  Patient presents with   Results    Review Lumbar MRI    60 year old female had physical therapy prednisone tramadol gabapentin Aleve meloxicam no improvement worsening pain right side radiating to right leg   The MRI is noted below.  The patient continues to have night pain right lower back hip radiating to right knee occasionally to the right leg no major symptoms during the day  I do not have a real cause for this at this time.  She does have fibromyalgia but I doubt this is the cause  I looked at her pelvis and knee films again that does not appear to be the cause.  The only thing I see on the MRI is bulging disks and facet arthritis   MRI LUMBAR:  Disc levels:   T11-T12: Disc desiccation and height loss, with mild disc bulge. No spinal canal stenosis or neural foraminal narrowing.   T12-L1: No significant disc bulge. No spinal canal stenosis or neural foraminal narrowing.   L1-L2: No significant disc bulge. No spinal canal stenosis or neural foraminal narrowing.   L2-L3: Minimal disc bulge. No spinal canal stenosis or neural foraminal narrowing.   L3-L4: Mild disc bulge. Mild facet arthropathy. No spinal canal stenosis or neural foraminal narrowing.   L4-L5: Mild disc bulge. Mild facet arthropathy. Previously noted right posteriorly directed synovial cyst is no longer seen. No spinal canal stenosis or neural foraminal narrowing.   L5-S1: No significant disc bulge. Mild facet arthropathy. No spinal canal stenosis or neural foraminal narrowing.   IMPRESSION: Mild disc degenerative changes and mild facet arthropathy. No spinal canal stenosis or neural foraminal narrowing.   Recommend she see a back specialist I have no other explanation for her symptoms

## 2021-02-03 ENCOUNTER — Other Ambulatory Visit (HOSPITAL_COMMUNITY): Payer: Self-pay | Admitting: Internal Medicine

## 2021-02-03 DIAGNOSIS — Z1231 Encounter for screening mammogram for malignant neoplasm of breast: Secondary | ICD-10-CM

## 2021-02-12 ENCOUNTER — Ambulatory Visit (HOSPITAL_COMMUNITY): Payer: 59

## 2021-02-20 ENCOUNTER — Encounter (HOSPITAL_COMMUNITY): Payer: Self-pay | Admitting: Physical Therapy

## 2021-02-20 ENCOUNTER — Ambulatory Visit (HOSPITAL_COMMUNITY): Payer: 59 | Attending: Neurological Surgery | Admitting: Physical Therapy

## 2021-02-20 ENCOUNTER — Other Ambulatory Visit: Payer: Self-pay

## 2021-02-20 DIAGNOSIS — R29898 Other symptoms and signs involving the musculoskeletal system: Secondary | ICD-10-CM | POA: Diagnosis present

## 2021-02-20 DIAGNOSIS — M545 Low back pain, unspecified: Secondary | ICD-10-CM | POA: Diagnosis not present

## 2021-02-20 DIAGNOSIS — M6281 Muscle weakness (generalized): Secondary | ICD-10-CM | POA: Insufficient documentation

## 2021-02-20 DIAGNOSIS — R2689 Other abnormalities of gait and mobility: Secondary | ICD-10-CM | POA: Insufficient documentation

## 2021-02-20 NOTE — Therapy (Signed)
[place], [place], [address] Phone: [phone]   Fax:  [phone]  Physical Therapy Evaluation  Patient Details  Name: [name] MRN: [id number] Date of Birth: Aug 18, 1960 Referring Provider (PT): Pieter Partridge Dawley DO   Encounter Date: 02/20/2021   PT End of Session - 02/20/21 1330     Visit Number 1    Number of Visits 12    Date for PT Re-Evaluation 04/03/21    Authorization Type UHC    Authorization - Visit Number 8    Authorization - Number of Visits 20    Progress Note Due on Visit 10    PT Start Time 1322   arrives late   PT Stop Time 1355    PT Time Calculation (min) 33 min    Activity Tolerance Patient tolerated treatment well    Behavior During Therapy [facility] for tasks assessed/performed             Past Medical History:  Diagnosis Date   Anemia    2002 after having hysterectomy   Anxiety    Chronic pain    Depression    Fibromyalgia    GERD (gastroesophageal reflux disease)    History of kidney stones    Lupus (HCC)    Migraine headache    PONV (postoperative nausea and vomiting)    Sleep apnea    "Not enough for a CPAP".    Past Surgical History:  Procedure Laterality Date   APPENDECTOMY     BREAST CYST ASPIRATION Left    CARDIAC CATHETERIZATION     x 2 and both were normal   CHOLECYSTECTOMY     COLONOSCOPY WITH PROPOFOL N/A 11/05/2017   Procedure: COLONOSCOPY WITH PROPOFOL;  Surgeon: Rogene Houston, MD;  [facility];  Service: Endoscopy;  Laterality: N/A;  8:30   complete hysterectomy     POLYPECTOMY  11/05/2017   Procedure: POLYPECTOMY;  Surgeon: Rogene Houston, MD;  [facility];  Service: Endoscopy;;  sigmoid   TONSILLECTOMY     TUBAL LIGATION      There were no vitals filed for this visit.    Subjective Assessment - 02/20/21 1324     Subjective Patient is a 60 y.o. female who presents to physical therapy with c/o low back pain. Patient was  being seen in July/August of 2022 and took a break while awaiting results of MRI. Patient states continued low back pain which runs down R leg and wraps around thigh. About 3-4 nights ago it started on L too. Its really bad at night. She does some of the exercises still. Sometimes the exercises make her hurts more. Moving certain ways and sitting increase symptoms. Symptoms decrease with pillow under buttock while laying on back. Patient states her main goal is to decrease pain.    Pertinent History chronic low back pain, H/A, fibro, lupus    Limitations Sitting;Standing;Walking;House hold activities;Lifting    How long can you sit comfortably? 20-30 minutes.    How long can you stand comfortably? 20 minutes    How long can you walk comfortably? 20 mintues    Patient Stated Goals to be able to get out of the bed in the morning.    Currently in Pain? Yes    Pain Score 3     Pain Location Back    Pain Orientation Lower    Pain Descriptors / Indicators Aching    Pain Type Chronic pain  Pain Onset More than a month ago    Pain Frequency Constant                [facility] PT Assessment - 02/20/21 0001       Assessment   Medical Diagnosis Sacroiliitis    Referring Provider (PT) Pieter Partridge Dawley DO    Onset Date/Surgical Date 04/15/20    Next MD Visit December    Prior Therapy yes years ago      Precautions   Precautions None      Restrictions   Weight Bearing Restrictions No      Balance Screen   Has the patient fallen in the past 6 months Yes    How many times? 1    Has the patient had a decrease in activity level because of a fear of falling?  No    Is the patient reluctant to leave their home because of a fear of falling?  No      Home Ecologist residence      Prior Function   Level of Independence Independent    Vocation Unemployed    Leisure crafts      Cognition   Overall Cognitive Status Within Functional Limits for tasks assessed       Observation/Other Assessments   Focus on Therapeutic Outcomes (FOTO)  63% function      Sit to Stand   Comments 16 in 30"      AROM   Lumbar Flexion 50% limited, pulling    Lumbar Extension 25% limited, feels good    Lumbar - Right Side Bend 25% limited, pain/pulling    Lumbar - Left Side Bend 25% limited, pain/pulling    Lumbar - Right Rotation 25% limited, pain/pulling    Lumbar - Left Rotation 25% limited, pain/pulling      Strength   Right Hip Flexion 4/5    Right Hip Extension 3+/5    Right Hip ABduction 4-/5    Left Hip Flexion 4/5    Left Hip Extension 4/5    Left Hip ABduction 4/5    Right Knee Flexion 5/5    Right Knee Extension 5/5    Left Knee Flexion 5/5    Left Knee Extension 5/5      Palpation   Palpation comment TTP lower lumbar paraspinals, sacrum      Special Tests   Other special tests squat: slight dynamic knee valgus                        Objective measurements completed on examination: See above findings.       [place] Adult PT Treatment/Exercise - 02/20/21 0001       Lumbar Exercises: Stretches   Standing Extension 5 reps    Press Ups 10 reps                     PT Education - 02/20/21 1323     Education Details Patient educated on exam findings, POC, scope of PT    Person(s) Educated Patient    Methods Explanation;Demonstration    Comprehension Verbalized understanding;Returned demonstration              PT Short Term Goals - 02/20/21 1401       PT SHORT TERM GOAL #1   Title Patient will be independent with HEP in order to improve functional outcomes.    Time 3    Period Weeks  Status New    Target Date 03/13/21      PT SHORT TERM GOAL #2   Title Patient will report at least 25% improvement in symptoms for improved quality of life.    Time 3    Period Weeks    Status New    Target Date 03/13/21               PT Long Term Goals - 02/20/21 1407       PT LONG TERM GOAL #1   Title  Patient will report at least 75% improvement in symptoms for improved quality of life.    Time 6    Period Weeks    Status New    Target Date 04/03/21      PT LONG TERM GOAL #2   Title Patient will improve FOTO score by at least 15 points in order to indicate improved tolerance to activity.    Time 6    Period Weeks    Status New    Target Date 04/03/21      PT LONG TERM GOAL #3   Title Patient will be able to sleep without increase in symptoms    Time 6    Period Weeks    Status New    Target Date 04/03/21                    Plan - 02/20/21 1358     Clinical Impression Statement Patient is a 60 y.o. female who presents to physical therapy with c/o low back pain and bilateral radicular symptoms. She presents with pain limited deficits in lumbar and LE strength, ROM, endurance, postural impairments, activity tolerance, and functional mobility with ADL. She is having to modify and restrict ADL as indicated by FOTO score as well as subjective information and objective measures which is affecting overall participation. Will trial extension exercises and assess for response and then continue with core strength. Patient will benefit from skilled physical therapy in order to improve function and reduce impairment.    Personal Factors and Comorbidities Time since onset of injury/illness/exacerbation    Examination-Activity Limitations Bend;Carry;Lift;Locomotion Level;Stand;Squat    Examination-Participation Restrictions Cleaning;Community Activity;Driving;Laundry;Shop    Stability/Clinical Decision Making Stable/Uncomplicated    Clinical Decision Making Moderate    Rehab Potential Good    PT Frequency 2x / week    PT Duration 6 weeks    PT Treatment/Interventions Patient/family education;Therapeutic activities;Therapeutic exercise;Manual techniques;ADLs/Self Care Home Management;Aquatic Therapy;Moist Heat;Traction;Ultrasound;Gait training;Stair training;Functional mobility  training;Balance training;Neuromuscular re-education;Orthotic Fit/Training;Compression bandaging;Scar mobilization;Passive range of motion;Dry needling;Splinting;Taping;Spinal Manipulations;Joint Manipulations    PT Next Visit Plan assess response to extension exercises, progress into core strength and pick up with old HEP as able    PT Home Exercise Plan 11/10 press up,, standing lumbar ext    Consulted and Agree with Plan of Care Patient             Patient will benefit from skilled therapeutic intervention in order to improve the following deficits and impairments:  Decreased activity tolerance, Decreased balance, Decreased mobility, Decreased strength, Difficulty walking, Pain  Visit Diagnosis: Low back pain, unspecified back pain laterality, unspecified chronicity, unspecified whether sciatica present  Muscle weakness (generalized)  Other abnormalities of gait and mobility  Other symptoms and signs involving the musculoskeletal system     Problem List Patient Active Problem List   Diagnosis Date Noted   Special screening for malignant neoplasms, colon 09/16/2017   Abdominal pain, left lower quadrant 07/06/2012  Depression 07/06/2012   Lupus ([place]) 07/06/2012   Chronic pain syndrome 07/06/2012   FHx: migraine headaches 07/06/2012   KNEE PAIN 03/16/2007   BACK PAIN 03/16/2007   FIBROMYALGIA/FIBROMYOSITIS 03/16/2007    2:12 PM, 02/20/21 Mearl Latin PT, DPT Physical Therapist at [place], [place], [address] Phone: [phone]   Fax:  [phone]  Name: [name] MRN: [id number] Date of Birth: February 08, 1961

## 2021-02-20 NOTE — Patient Instructions (Signed)
Access Code: [email or web address] URL: [email or web address] Date: 02/20/2021 Prepared by: Mitzi Hansen Hagen Bohorquez  Exercises Standing Lumbar Extension - 1 x daily - 7 x weekly - 2 sets - 10 reps Prone Press Up - 1 x daily - 7 x weekly - 2 sets - 10 reps

## 2021-02-26 ENCOUNTER — Other Ambulatory Visit (HOSPITAL_COMMUNITY): Payer: Self-pay | Admitting: Internal Medicine

## 2021-02-26 ENCOUNTER — Ambulatory Visit (HOSPITAL_COMMUNITY)
Admission: RE | Admit: 2021-02-26 | Discharge: 2021-02-26 | Disposition: A | Discharge status: Home / Self Care | Payer: 59 | Source: Ambulatory Visit | Attending: Internal Medicine | Admitting: Internal Medicine

## 2021-02-26 ENCOUNTER — Other Ambulatory Visit: Payer: Self-pay

## 2021-02-26 DIAGNOSIS — Z1231 Encounter for screening mammogram for malignant neoplasm of breast: Secondary | ICD-10-CM | POA: Insufficient documentation

## 2021-02-26 DIAGNOSIS — R928 Other abnormal and inconclusive findings on diagnostic imaging of breast: Secondary | ICD-10-CM

## 2021-02-27 ENCOUNTER — Ambulatory Visit (HOSPITAL_COMMUNITY)
Admission: RE | Admit: 2021-02-27 | Discharge: 2021-02-27 | Disposition: A | Discharge status: Home / Self Care | Payer: 59 | Source: Ambulatory Visit | Attending: Internal Medicine | Admitting: Internal Medicine

## 2021-02-27 ENCOUNTER — Encounter (HOSPITAL_COMMUNITY): Payer: 59

## 2021-02-27 DIAGNOSIS — R928 Other abnormal and inconclusive findings on diagnostic imaging of breast: Secondary | ICD-10-CM | POA: Diagnosis present

## 2021-02-28 ENCOUNTER — Encounter (HOSPITAL_COMMUNITY): Payer: Self-pay

## 2021-02-28 ENCOUNTER — Telehealth (HOSPITAL_COMMUNITY): Payer: Self-pay

## 2021-02-28 ENCOUNTER — Other Ambulatory Visit: Payer: Self-pay

## 2021-02-28 ENCOUNTER — Ambulatory Visit (HOSPITAL_COMMUNITY): Payer: 59

## 2021-02-28 ENCOUNTER — Encounter (HOSPITAL_COMMUNITY): Payer: 59

## 2021-02-28 DIAGNOSIS — M545 Low back pain, unspecified: Secondary | ICD-10-CM

## 2021-02-28 DIAGNOSIS — M6281 Muscle weakness (generalized): Secondary | ICD-10-CM

## 2021-02-28 DIAGNOSIS — R2689 Other abnormalities of gait and mobility: Secondary | ICD-10-CM

## 2021-02-28 DIAGNOSIS — R29898 Other symptoms and signs involving the musculoskeletal system: Secondary | ICD-10-CM

## 2021-02-28 NOTE — Therapy (Signed)
[place], [place], [address] Phone: [phone]   Fax:  [phone]  Physical Therapy Treatment  Patient Details  Name: [name] MRN: [id number] Date of Birth: 1960-12-24 Referring Provider (PT): Pieter Partridge Dawley DO   Encounter Date: 02/28/2021   PT End of Session - 02/28/21 1145     Visit Number 2    Number of Visits 12    Date for PT Re-Evaluation 04/03/21    Authorization Type UHC    Authorization - Visit Number 2    Authorization - Number of Visits 12    Progress Note Due on Visit 10    PT Start Time 1139    PT Stop Time 1217    PT Time Calculation (min) 38 min    Activity Tolerance Patient tolerated treatment well    Behavior During Therapy [facility] for tasks assessed/performed             Past Medical History:  Diagnosis Date   Anemia    2002 after having hysterectomy   Anxiety    Chronic pain    Depression    Fibromyalgia    GERD (gastroesophageal reflux disease)    History of kidney stones    Lupus (HCC)    Migraine headache    PONV (postoperative nausea and vomiting)    Sleep apnea    "Not enough for a CPAP".    Past Surgical History:  Procedure Laterality Date   APPENDECTOMY     BREAST CYST ASPIRATION Left    CARDIAC CATHETERIZATION     x 2 and both were normal   CHOLECYSTECTOMY     COLONOSCOPY WITH PROPOFOL N/A 11/05/2017   Procedure: COLONOSCOPY WITH PROPOFOL;  Surgeon: Rogene Houston, MD;  [facility];  Service: Endoscopy;  Laterality: N/A;  8:30   complete hysterectomy     POLYPECTOMY  11/05/2017   Procedure: POLYPECTOMY;  Surgeon: Rogene Houston, MD;  [facility];  Service: Endoscopy;;  sigmoid   TONSILLECTOMY     TUBAL LIGATION      There were no vitals filed for this visit.   Subjective Assessment - 02/28/21 1142     Subjective Pt reports she feels no better or worse, continues to c/o stiffness at night and radicular symptoms at night Rt   down to toe and Lt thigh.    Pertinent History chronic low back pain, H/A, fibro, lupus    Patient Stated Goals to be able to get out of the bed in the morning.    Currently in Pain? Yes    Pain Score 2     Pain Location Back    Pain Orientation Lower    Pain Descriptors / Indicators Aching    Pain Type Chronic pain    Pain Radiating Towards Rt LE to toes, Lt thigh    Pain Onset More than a month ago    Pain Frequency Constant    Aggravating Factors  sitting, laying    Pain Relieving Factors steroid calmed down.    Effect of Pain on Daily Activities limits                [facility] PT Assessment - 02/28/21 0001       Assessment   Medical Diagnosis Sacroiliitis    Referring Provider (PT) Pieter Partridge Dawley DO    Onset Date/Surgical Date 04/15/20    Next MD Visit December    Prior Therapy yes years ago  Precautions   Precautions None                           [facility] Adult PT Treatment/Exercise - 02/28/21 0001       Lumbar Exercises: Stretches   Prone on Elbows Stretch Limitations 2 min    Press Ups 5 reps    Press Ups Limitations radicular symptoms down Rt LE    Other Lumbar Stretch Exercise cat/camel 5x 10"    Other Lumbar Stretch Exercise child's pose 3x 30"      Lumbar Exercises: Prone   Straight Leg Raise 10 reps      Lumbar Exercises: Quadruped   Single Arm Raise 5 reps    Straight Leg Raise 5 reps                       PT Short Term Goals - 02/20/21 1401       PT SHORT TERM GOAL #1   Title Patient will be independent with HEP in order to improve functional outcomes.    Time 3    Period Weeks    Status New    Target Date 03/13/21      PT SHORT TERM GOAL #2   Title Patient will report at least 25% improvement in symptoms for improved quality of life.    Time 3    Period Weeks    Status New    Target Date 03/13/21               PT Long Term Goals - 02/20/21 1407       PT LONG TERM GOAL #1   Title Patient will  report at least 75% improvement in symptoms for improved quality of life.    Time 6    Period Weeks    Status New    Target Date 04/03/21      PT LONG TERM GOAL #2   Title Patient will improve FOTO score by at least 15 points in order to indicate improved tolerance to activity.    Time 6    Period Weeks    Status New    Target Date 04/03/21      PT LONG TERM GOAL #3   Title Patient will be able to sleep without increase in symptoms    Time 6    Period Weeks    Status New    Target Date 04/03/21                   Plan - 02/28/21 1155     Clinical Impression Statement Reviewed goals, educated importance of HEP compliance for maximal benefits, assess current HEP with reports of no change wiht current prone/extension based exercises.  Session focus with core and proximal strengthening.  Pt reports increased radicular symptoms with press up.  Added mobility and core stability exercises, pt able to complete in pain free range and no radicular symptoms.  Added to HEP.  Encouraged to complete these exercises at night prior bed time and in morning to address stiffness.    Personal Factors and Comorbidities Time since onset of injury/illness/exacerbation    Examination-Activity Limitations Bend;Carry;Lift;Locomotion Level;Stand;Squat    Examination-Participation Restrictions Cleaning;Community Activity;Driving;Laundry;Shop    Stability/Clinical Decision Making Stable/Uncomplicated    Clinical Decision Making Moderate    Rehab Potential Good    PT Frequency 2x / week    PT Duration 6 weeks    PT Treatment/Interventions Patient/family  education;Therapeutic activities;Therapeutic exercise;Manual techniques;ADLs/Self Care Home Management;Aquatic Therapy;Moist Heat;Traction;Ultrasound;Gait training;Stair training;Functional mobility training;Balance training;Neuromuscular re-education;Orthotic Fit/Training;Compression bandaging;Scar mobilization;Passive range of motion;Dry  needling;Splinting;Taping;Spinal Manipulations;Joint Manipulations    PT Next Visit Plan assess response to extension exercises, progress into core strength and pick up with old HEP as able    PT Home Exercise Plan 11/10 press up,, standing lumbar ext; 11/18: childs pose, cat/camel, quadruped UE/LE    Consulted and Agree with Plan of Care Patient             Patient will benefit from skilled therapeutic intervention in order to improve the following deficits and impairments:  Decreased activity tolerance, Decreased balance, Decreased mobility, Decreased strength, Difficulty walking, Pain  Visit Diagnosis: Low back pain, unspecified back pain laterality, unspecified chronicity, unspecified whether sciatica present  Muscle weakness (generalized)  Other abnormalities of gait and mobility  Other symptoms and signs involving the musculoskeletal system     Problem List Patient Active Problem List   Diagnosis Date Noted   Special screening for malignant neoplasms, colon 09/16/2017   Abdominal pain, left lower quadrant 07/06/2012   Depression 07/06/2012   Lupus ([place]) 07/06/2012   Chronic pain syndrome 07/06/2012   FHx: migraine headaches 07/06/2012   KNEE PAIN 03/16/2007   BACK PAIN 03/16/2007   FIBROMYALGIA/FIBROMYOSITIS 03/16/2007   Ihor Austin, LPTA/CLT; CBIS [phone]  Aldona Lento, PTA 02/28/2021, 12:48 PM  [place], [place], [address] Phone: [phone]   Fax:  [phone]  Name: [name] MRN: [id number] Date of Birth: 01-04-1961

## 2021-02-28 NOTE — Telephone Encounter (Signed)
No show, called and left message concerning missed apt.  Included next apt date and time in message with contact number included if needs to cancel or reschedule apt in the future.  Included no show policy details in message.    Received call back while documenting, pt thought apt scheduled for 10:45.  That spot was taken, scheduled for 11:30 this afternoon.    Ihor Austin, LPTA/CLT; Delana Meyer [phone]

## 2021-03-04 ENCOUNTER — Encounter (HOSPITAL_COMMUNITY): Payer: 59 | Admitting: Physical Therapy

## 2021-03-11 ENCOUNTER — Encounter (HOSPITAL_COMMUNITY): Payer: Self-pay | Admitting: Physical Therapy

## 2021-03-11 ENCOUNTER — Other Ambulatory Visit: Payer: Self-pay

## 2021-03-11 ENCOUNTER — Ambulatory Visit (HOSPITAL_COMMUNITY): Payer: 59 | Admitting: Physical Therapy

## 2021-03-11 DIAGNOSIS — M6281 Muscle weakness (generalized): Secondary | ICD-10-CM

## 2021-03-11 DIAGNOSIS — R2689 Other abnormalities of gait and mobility: Secondary | ICD-10-CM

## 2021-03-11 DIAGNOSIS — M545 Low back pain, unspecified: Secondary | ICD-10-CM | POA: Diagnosis not present

## 2021-03-11 DIAGNOSIS — R29898 Other symptoms and signs involving the musculoskeletal system: Secondary | ICD-10-CM

## 2021-03-11 NOTE — Therapy (Signed)
[place], [place], [address] Phone: [phone]   Fax:  [phone]  Physical Therapy Treatment  Patient Details  Name: [name] MRN: [id number] Date of Birth: 1960-04-25 Referring Provider (PT): Pieter Partridge Dawley DO   Encounter Date: 03/11/2021   PT End of Session - 03/11/21 1403     Visit Number 3    Number of Visits 12    Date for PT Re-Evaluation 04/03/21    Authorization Type UHC    Authorization - Visit Number 3    Authorization - Number of Visits 12    Progress Note Due on Visit 10    PT Start Time 1403    PT Stop Time 6333    PT Time Calculation (min) 39 min    Activity Tolerance Patient tolerated treatment well    Behavior During Therapy [facility] for tasks assessed/performed             Past Medical History:  Diagnosis Date   Anemia    2002 after having hysterectomy   Anxiety    Chronic pain    Depression    Fibromyalgia    GERD (gastroesophageal reflux disease)    History of kidney stones    Lupus (HCC)    Migraine headache    PONV (postoperative nausea and vomiting)    Sleep apnea    "Not enough for a CPAP".    Past Surgical History:  Procedure Laterality Date   APPENDECTOMY     BREAST CYST ASPIRATION Left    CARDIAC CATHETERIZATION     x 2 and both were normal   CHOLECYSTECTOMY     COLONOSCOPY WITH PROPOFOL N/A 11/05/2017   Procedure: COLONOSCOPY WITH PROPOFOL;  Surgeon: Rogene Houston, MD;  [facility];  Service: Endoscopy;  Laterality: N/A;  8:30   complete hysterectomy     POLYPECTOMY  11/05/2017   Procedure: POLYPECTOMY;  Surgeon: Rogene Houston, MD;  [facility];  Service: Endoscopy;;  sigmoid   TONSILLECTOMY     TUBAL LIGATION      There were no vitals filed for this visit.   Subjective Assessment - 03/11/21 1404     Subjective Back bending ones feel good. Exercises from last time felt good too. Symptoms overall the same with sitting/  sleeping. Continued symptoms with sitting for greater than 30 minutes, symptoms ease with exercise.    Pertinent History chronic low back pain, H/A, fibro, lupus    Patient Stated Goals to be able to get out of the bed in the morning.    Currently in Pain? Yes    Pain Score 1     Pain Location Leg    Pain Orientation Lower    Pain Descriptors / Indicators Aching    Pain Type Chronic pain    Pain Onset More than a month ago                               [facility] Adult PT Treatment/Exercise - 03/11/21 0001       Lumbar Exercises: Stretches   Other Lumbar Stretch Exercise supine glute stretch/SKTC 4x 20 second holds      Lumbar Exercises: Prone   Straight Leg Raise 10 reps    Straight Leg Raises Limitations 2 sets      Manual Therapy   Manual Therapy Soft tissue mobilization    Manual therapy comments completed  independent from all other aspects of treatment    Soft tissue mobilization R proximal glute; self stm with tennis ball                     PT Education - 03/11/21 1404     Education Details HEP, lumbar support, sitting positions    Person(s) Educated Patient    Methods Explanation;Demonstration    Comprehension Verbalized understanding;Returned demonstration              PT Short Term Goals - 02/20/21 1401       PT SHORT TERM GOAL #1   Title Patient will be independent with HEP in order to improve functional outcomes.    Time 3    Period Weeks    Status New    Target Date 03/13/21      PT SHORT TERM GOAL #2   Title Patient will report at least 25% improvement in symptoms for improved quality of life.    Time 3    Period Weeks    Status New    Target Date 03/13/21               PT Long Term Goals - 02/20/21 1407       PT LONG TERM GOAL #1   Title Patient will report at least 75% improvement in symptoms for improved quality of life.    Time 6    Period Weeks    Status New    Target Date 04/03/21      PT LONG  TERM GOAL #2   Title Patient will improve FOTO score by at least 15 points in order to indicate improved tolerance to activity.    Time 6    Period Weeks    Status New    Target Date 04/03/21      PT LONG TERM GOAL #3   Title Patient will be able to sleep without increase in symptoms    Time 6    Period Weeks    Status New    Target Date 04/03/21                   Plan - 03/11/21 1403     Clinical Impression Statement Patient stating improving symptoms with lumbar extension exercises following but no change in symptoms overall. Continued symptoms with sitting/lying at home and RLE radicular symptoms. Patient tender in proximal glute origin area near PSIS. Completed glute stretch with no change in symptoms following. Completed manual to R proximal glute with decrease in symptoms following. Patient educated and performs self STM with tennis ball.  Patient will continue to benefit from skilled physical therapy in order to reduce impairment and improve function.    Personal Factors and Comorbidities Time since onset of injury/illness/exacerbation    Examination-Activity Limitations Bend;Carry;Lift;Locomotion Level;Stand;Squat    Examination-Participation Restrictions Cleaning;Community Activity;Driving;Laundry;Shop    Stability/Clinical Decision Making Stable/Uncomplicated    Rehab Potential Good    PT Frequency 2x / week    PT Duration 6 weeks    PT Treatment/Interventions Patient/family education;Therapeutic activities;Therapeutic exercise;Manual techniques;ADLs/Self Care Home Management;Aquatic Therapy;Moist Heat;Traction;Ultrasound;Gait training;Stair training;Functional mobility training;Balance training;Neuromuscular re-education;Orthotic Fit/Training;Compression bandaging;Scar mobilization;Passive range of motion;Dry needling;Splinting;Taping;Spinal Manipulations;Joint Manipulations    PT Next Visit Plan assess response to extension exercises, progress into core strength and  pick up with old HEP as able    PT Home Exercise Plan 11/10 press up,, standing lumbar ext; 11/18: childs pose, cat/camel, quadruped UE/LE    Consulted and  Agree with Plan of Care Patient             Patient will benefit from skilled therapeutic intervention in order to improve the following deficits and impairments:  Decreased activity tolerance, Decreased balance, Decreased mobility, Decreased strength, Difficulty walking, Pain  Visit Diagnosis: Low back pain, unspecified back pain laterality, unspecified chronicity, unspecified whether sciatica present  Muscle weakness (generalized)  Other abnormalities of gait and mobility  Other symptoms and signs involving the musculoskeletal system     Problem List Patient Active Problem List   Diagnosis Date Noted   Special screening for malignant neoplasms, colon 09/16/2017   Abdominal pain, left lower quadrant 07/06/2012   Depression 07/06/2012   Lupus ([place]) 07/06/2012   Chronic pain syndrome 07/06/2012   FHx: migraine headaches 07/06/2012   KNEE PAIN 03/16/2007   BACK PAIN 03/16/2007   FIBROMYALGIA/FIBROMYOSITIS 03/16/2007    2:45 PM, 03/11/21 Mearl Latin PT, DPT Physical Therapist at [place], [place], [address] Phone: [phone]   Fax:  [phone]  Name: BERNADETT MILIAN MRN: [id number] Date of Birth: 1961/02/01

## 2021-03-11 NOTE — Patient Instructions (Signed)
Access Code: [email or web address] URL: [email or web address] Date: 03/11/2021 Prepared by: Mitzi Hansen Morganne Haile  Exercises Standing massage with ball at wall on low back - 1 x daily - 7 x weekly Prone Hip Extension - 1 x daily - 7 x weekly - 2 sets - 10 reps

## 2021-03-13 ENCOUNTER — Ambulatory Visit (HOSPITAL_COMMUNITY): Payer: 59 | Admitting: Physical Therapy

## 2021-03-13 ENCOUNTER — Telehealth (HOSPITAL_COMMUNITY): Payer: Self-pay | Admitting: Physical Therapy

## 2021-03-13 NOTE — Telephone Encounter (Signed)
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

## 2021-03-18 ENCOUNTER — Encounter (HOSPITAL_COMMUNITY): Payer: Self-pay | Admitting: Physical Therapy

## 2021-03-18 ENCOUNTER — Ambulatory Visit (HOSPITAL_COMMUNITY): Payer: 59 | Attending: Neurological Surgery | Admitting: Physical Therapy

## 2021-03-18 ENCOUNTER — Other Ambulatory Visit: Payer: Self-pay

## 2021-03-18 DIAGNOSIS — M545 Low back pain, unspecified: Secondary | ICD-10-CM | POA: Diagnosis not present

## 2021-03-18 DIAGNOSIS — R2689 Other abnormalities of gait and mobility: Secondary | ICD-10-CM | POA: Diagnosis present

## 2021-03-18 DIAGNOSIS — M6281 Muscle weakness (generalized): Secondary | ICD-10-CM | POA: Insufficient documentation

## 2021-03-18 DIAGNOSIS — R29898 Other symptoms and signs involving the musculoskeletal system: Secondary | ICD-10-CM | POA: Insufficient documentation

## 2021-03-18 NOTE — Therapy (Signed)
[name], [place], [address] Phone: [phone]   Fax:  [phone]  Physical Therapy Treatment  Patient Details  Name: LEE-ANN [name] MRN: [id number] Date of Birth: 1960-07-02 Referring Provider (PT): Christine Partridge Dawley DO   Encounter Date: 03/18/2021   PT End of Session - 03/18/21 1331     Visit Number 4    Number of Visits 12    Date for PT Re-Evaluation 04/03/21    Authorization Type UHC    Authorization - Visit Number 4    Authorization - Number of Visits 12    Progress Note Due on Visit 10    PT Start Time 1332   arrives late   PT Stop Time 1355    PT Time Calculation (min) 23 min    Activity Tolerance Patient tolerated treatment well    Behavior During Therapy [facility] for tasks assessed/performed             Past Medical History:  Diagnosis Date   Anemia    2002 after having hysterectomy   Anxiety    Chronic pain    Depression    Fibromyalgia    GERD (gastroesophageal reflux disease)    History of kidney stones    Lupus (HCC)    Migraine headache    PONV (postoperative nausea and vomiting)    Sleep apnea    "Not enough for a CPAP".    Past Surgical History:  Procedure Laterality Date   APPENDECTOMY     BREAST CYST ASPIRATION Left    CARDIAC CATHETERIZATION     x 2 and both were normal   CHOLECYSTECTOMY     COLONOSCOPY WITH PROPOFOL N/A 11/05/2017   Procedure: COLONOSCOPY WITH PROPOFOL;  Surgeon: Rogene Houston, MD;  [facility];  Service: Endoscopy;  Laterality: N/A;  8:30   complete hysterectomy     POLYPECTOMY  11/05/2017   Procedure: POLYPECTOMY;  Surgeon: Rogene Houston, MD;  [facility];  Service: Endoscopy;;  sigmoid   TONSILLECTOMY     TUBAL LIGATION      There were no vitals filed for this visit.   Subjective Assessment - 03/18/21 1333     Subjective Patient states her symptoms are about the same. She has been moving more frequently at night and  that has helped. She has slept better lately.    Pertinent History chronic low back pain, H/A, fibro, lupus    Patient Stated Goals to be able to get out of the bed in the morning.    Currently in Pain? No/denies    Pain Onset More than a month ago                               [facility] Adult PT Treatment/Exercise - 03/18/21 0001       Lumbar Exercises: Sidelying   Hip Abduction Right;10 reps    Hip Abduction Limitations 2 sets      Lumbar Exercises: Prone   Straight Leg Raise 10 reps    Straight Leg Raises Limitations 2 sets      Manual Therapy   Manual Therapy Soft tissue mobilization    Manual therapy comments completed independent from all other aspects of treatment    Soft tissue mobilization R proximal glute                     PT  Education - 03/18/21 1332     Education Details HEP    Person(s) Educated Patient    Methods Explanation;Demonstration    Comprehension Verbalized understanding;Returned demonstration              PT Short Term Goals - 02/20/21 1401       PT SHORT TERM GOAL #1   Title Patient will be independent with HEP in order to improve functional outcomes.    Time 3    Period Weeks    Status New    Target Date 03/13/21      PT SHORT TERM GOAL #2   Title Patient will report at least 25% improvement in symptoms for improved quality of life.    Time 3    Period Weeks    Status New    Target Date 03/13/21               PT Long Term Goals - 02/20/21 1407       PT LONG TERM GOAL #1   Title Patient will report at least 75% improvement in symptoms for improved quality of life.    Time 6    Period Weeks    Status New    Target Date 04/03/21      PT LONG TERM GOAL #2   Title Patient will improve FOTO score by at least 15 points in order to indicate improved tolerance to activity.    Time 6    Period Weeks    Status New    Target Date 04/03/21      PT LONG TERM GOAL #3   Title Patient will be able  to sleep without increase in symptoms    Time 6    Period Weeks    Status New    Target Date 04/03/21                   Plan - 03/18/21 1332     Clinical Impression Statement Session limited due patient's late arrival. Patient making small improvements in symptoms with prior education and HEP provided. Continued with glute strength with minimal cueing for mechanics. Patient with continued tenderness in R glute max and med which improves with STM. Patient will continue to benefit from skilled physical therapy in order to reduce impairment and improve function.    Personal Factors and Comorbidities Time since onset of injury/illness/exacerbation    Examination-Activity Limitations Bend;Carry;Lift;Locomotion Level;Stand;Squat    Examination-Participation Restrictions Cleaning;Community Activity;Driving;Laundry;Shop    Stability/Clinical Decision Making Stable/Uncomplicated    Rehab Potential Good    PT Frequency 2x / week    PT Duration 6 weeks    PT Treatment/Interventions Patient/family education;Therapeutic activities;Therapeutic exercise;Manual techniques;ADLs/Self Care Home Management;Aquatic Therapy;Moist Heat;Traction;Ultrasound;Gait training;Stair training;Functional mobility training;Balance training;Neuromuscular re-education;Orthotic Fit/Training;Compression bandaging;Scar mobilization;Passive range of motion;Dry needling;Splinting;Taping;Spinal Manipulations;Joint Manipulations    PT Next Visit Plan progress into core strength and pick up with old HEP as able    PT Home Exercise Plan 11/10 press up,, standing lumbar ext; 11/18: childs pose, cat/camel, quadruped UE/LE 12/6 hip ext, abduction    Consulted and Agree with Plan of Care Patient             Patient will benefit from skilled therapeutic intervention in order to improve the following deficits and impairments:  Decreased activity tolerance, Decreased balance, Decreased mobility, Decreased strength, Difficulty  walking, Pain  Visit Diagnosis: Low back pain, unspecified back pain laterality, unspecified chronicity, unspecified whether sciatica present  Muscle weakness (generalized)  Other abnormalities  of gait and mobility  Other symptoms and signs involving the musculoskeletal system     Problem List Patient Active Problem List   Diagnosis Date Noted   Special screening for malignant neoplasms, colon 09/16/2017   Abdominal pain, left lower quadrant 07/06/2012   Depression 07/06/2012   Lupus ([place]) 07/06/2012   Chronic pain syndrome 07/06/2012   FHx: migraine headaches 07/06/2012   KNEE PAIN 03/16/2007   BACK PAIN 03/16/2007   FIBROMYALGIA/FIBROMYOSITIS 03/16/2007    1:59 PM, 03/18/21 Mearl Latin PT, DPT Physical Therapist at [place], [place], [address] Phone: [phone]   Fax:  [phone]  Name: [name] MRN: [id number] Date of Birth: 1961-01-12

## 2021-03-18 NOTE — Patient Instructions (Signed)
Access Code: [email or web address] URL: [email or web address] Date: 03/18/2021 Prepared by: Mitzi Hansen Doshia Dalia  Exercises Sidelying Hip Abduction - 1 x daily - 7 x weekly - 2 sets - 10 reps

## 2021-03-20 ENCOUNTER — Encounter (HOSPITAL_COMMUNITY): Payer: Self-pay | Admitting: Physical Therapy

## 2021-03-20 ENCOUNTER — Ambulatory Visit (HOSPITAL_COMMUNITY): Payer: 59 | Admitting: Physical Therapy

## 2021-03-20 ENCOUNTER — Other Ambulatory Visit: Payer: Self-pay

## 2021-03-20 DIAGNOSIS — M545 Low back pain, unspecified: Secondary | ICD-10-CM

## 2021-03-20 DIAGNOSIS — M6281 Muscle weakness (generalized): Secondary | ICD-10-CM

## 2021-03-20 NOTE — Patient Instructions (Signed)
Access Code: [email or web address] URL: [email or web address] Date: 03/20/2021 Prepared by: Yetta Glassman  Exercises Supine Bridge - 3 sets - 10 reps Supine Double Knee to Chest - 2 sets - 15 reps - 10 hold Supine Bilateral Hip Internal Rotation Stretch - 3 sets - 10 reps Bent Knee Fallouts - 3 sets - 10 reps - 5 hold Bear Plank from [employer] - 6 reps - 5 hold

## 2021-03-20 NOTE — Therapy (Signed)
[place], [place], [address] Phone: [phone]   Fax:  [phone]  Physical Therapy Treatment  Patient Details  Name: [name] MRN: [id number] Date of Birth: [birth year] Referring Provider (PT): Pieter Partridge Dawley DO   Encounter Date: 03/20/2021   PT End of Session - 03/20/21 1005     Visit Number 5    Number of Visits 12    Date for PT Re-Evaluation 04/03/21    Authorization Type UHC    Authorization - Visit Number 5    Authorization - Number of Visits 12    Progress Note Due on Visit 10    PT Start Time 1005    PT Stop Time 1043    PT Time Calculation (min) 38 min    Activity Tolerance Patient tolerated treatment well    Behavior During Therapy [facility] for tasks assessed/performed             Past Medical History:  Diagnosis Date   Anemia    2002 after having hysterectomy   Anxiety    Chronic pain    Depression    Fibromyalgia    GERD (gastroesophageal reflux disease)    History of kidney stones    Lupus (HCC)    Migraine headache    PONV (postoperative nausea and vomiting)    Sleep apnea    "Not enough for a CPAP".    Past Surgical History:  Procedure Laterality Date   APPENDECTOMY     BREAST CYST ASPIRATION Left    CARDIAC CATHETERIZATION     x 2 and both were normal   CHOLECYSTECTOMY     COLONOSCOPY WITH PROPOFOL N/A 11/05/2017   Procedure: COLONOSCOPY WITH PROPOFOL;  Surgeon: Rogene Houston, MD;  [facility];  Service: Endoscopy;  Laterality: N/A;  8:30   complete hysterectomy     POLYPECTOMY  11/05/2017   Procedure: POLYPECTOMY;  Surgeon: Rogene Houston, MD;  [facility];  Service: Endoscopy;;  sigmoid   TONSILLECTOMY     TUBAL LIGATION      There were no vitals filed for this visit.   Subjective Assessment - 03/20/21 1008     Subjective States she has been sleeping better but last night she had a nightmare which woke her up not her pain. States that  her exercises are going well with no difficulties. States she really likes the cat/cow exercise.    Pertinent History chronic low back pain, H/A, fibro, lupus    Patient Stated Goals to be able to get out of the bed in the morning.    Currently in Pain? No/denies    Pain Onset More than a month ago                [facility] PT Assessment - 03/20/21 0001       Assessment   Medical Diagnosis Sacroiliitis    Referring Provider (PT) Pieter Partridge Dawley DO    Onset Date/Surgical Date 04/15/20                           [facility] Adult PT Treatment/Exercise - 03/20/21 0001       Lumbar Exercises: Stretches   Double Knee to Chest Stretch --   10 second holds and 2 minutes total   Lower Trunk Rotation --   10 second hold x2   Other Lumbar Stretch Exercise hip IR 2 minutes B, hip ER  bilaterally 2 minutes supine both positoin      Lumbar Exercises: Supine   Bridge 10 reps;3 seconds   3 sets     Lumbar Exercises: Prone   Other Prone Lumbar Exercises child's pose  2 minutes totalwith 10 second holds in position      Lumbar Exercises: Quadruped   [place] Horse --   2 minutes with verbal and tactile cues throughout   Plank quad lift off x6 10" holds                       PT Short Term Goals - 02/20/21 1401       PT SHORT TERM GOAL #1   Title Patient will be independent with HEP in order to improve functional outcomes.    Time 3    Period Weeks    Status New    Target Date 03/13/21      PT SHORT TERM GOAL #2   Title Patient will report at least 25% improvement in symptoms for improved quality of life.    Time 3    Period Weeks    Status New    Target Date 03/13/21               PT Long Term Goals - 02/20/21 1407       PT LONG TERM GOAL #1   Title Patient will report at least 75% improvement in symptoms for improved quality of life.    Time 6    Period Weeks    Status New    Target Date 04/03/21      PT LONG TERM GOAL #2   Title Patient will  improve FOTO score by at least 15 points in order to indicate improved tolerance to activity.    Time 6    Period Weeks    Status New    Target Date 04/03/21      PT LONG TERM GOAL #3   Title Patient will be able to sleep without increase in symptoms    Time 6    Period Weeks    Status New    Target Date 04/03/21                   Plan - 03/20/21 1039     Clinical Impression Statement Able to progress exercises without difficulty on this date. No reports of pain during or after session. Tactile and verbal cues with all exercises initially but able to fade to intermittent verbal cues. Will continue to progress strength and mobility as tolerated. Fatigue noted end of session.    Personal Factors and Comorbidities Time since onset of injury/illness/exacerbation    Examination-Activity Limitations Bend;Carry;Lift;Locomotion Level;Stand;Squat    Examination-Participation Restrictions Cleaning;Community Activity;Driving;Laundry;Shop    Stability/Clinical Decision Making Stable/Uncomplicated    Rehab Potential Good    PT Frequency 2x / week    PT Duration 6 weeks    PT Treatment/Interventions Patient/family education;Therapeutic activities;Therapeutic exercise;Manual techniques;ADLs/Self Care Home Management;Aquatic Therapy;Moist Heat;Traction;Ultrasound;Gait training;Stair training;Functional mobility training;Balance training;Neuromuscular re-education;Orthotic Fit/Training;Compression bandaging;Scar mobilization;Passive range of motion;Dry needling;Splinting;Taping;Spinal Manipulations;Joint Manipulations    PT Next Visit Plan progress into core strength and pick up with old HEP as able    PT Home Exercise Plan 11/10 press up,, standing lumbar ext; 11/18: childs pose, cat/camel, quadruped UE/LE 12/6 hip ext, abduction; 12/8 child's pose, quad plank, hip IR/ER, bridge    Consulted and Agree with Plan of Care Patient  Patient will benefit from skilled therapeutic  intervention in order to improve the following deficits and impairments:  Decreased activity tolerance, Decreased balance, Decreased mobility, Decreased strength, Difficulty walking, Pain  Visit Diagnosis: Low back pain, unspecified back pain laterality, unspecified chronicity, unspecified whether sciatica present  Muscle weakness (generalized)     Problem List Patient Active Problem List   Diagnosis Date Noted   Special screening for malignant neoplasms, colon 09/16/2017   Abdominal pain, left lower quadrant 07/06/2012   Depression 07/06/2012   Lupus ([place]) 07/06/2012   Chronic pain syndrome 07/06/2012   FHx: migraine headaches 07/06/2012   KNEE PAIN 03/16/2007   BACK PAIN 03/16/2007   FIBROMYALGIA/FIBROMYOSITIS 03/16/2007  10:47 AM, 03/20/21 Jerene Pitch, DPT Physical Therapy with [facility]  [phone] office   [place], [place], [address] Phone: [phone]   Fax:  [phone]  Name: EVELEIGH CRUMPLER MRN: [id number] Date of Birth: 1960-09-03

## 2021-03-25 ENCOUNTER — Other Ambulatory Visit: Payer: Self-pay

## 2021-03-25 ENCOUNTER — Encounter (HOSPITAL_COMMUNITY): Payer: Self-pay

## 2021-03-25 ENCOUNTER — Ambulatory Visit (HOSPITAL_COMMUNITY): Payer: 59

## 2021-03-25 DIAGNOSIS — M545 Low back pain, unspecified: Secondary | ICD-10-CM

## 2021-03-25 DIAGNOSIS — R2689 Other abnormalities of gait and mobility: Secondary | ICD-10-CM

## 2021-03-25 DIAGNOSIS — M6281 Muscle weakness (generalized): Secondary | ICD-10-CM

## 2021-03-25 DIAGNOSIS — R29898 Other symptoms and signs involving the musculoskeletal system: Secondary | ICD-10-CM

## 2021-03-25 NOTE — Therapy (Addendum)
[facility] [address] [place], [place], [address] Phone: [phone]   Fax:  [phone]  Physical Therapy Treatment and Discharge Note   Patient Details  Name: [name] MRN: [id number] Date of Birth: 09-08-1960 Referring Provider (PT): Pieter Partridge Dawley DO   PHYSICAL THERAPY DISCHARGE SUMMARY  Visits from Start of Care: 6  Current functional level related to goals / functional outcomes: See below   Remaining deficits: See below   Education / Equipment: See below  Patient agrees to discharge. Patient goals were not met. Patient is being discharged due to  MD request to hold all therapies for now.   12:56 PM, 03/27/21 Jerene Pitch, DPT Physical Therapy with [facility]  [phone] office   Encounter Date: 03/25/2021   PT End of Session - 03/25/21 1015     Visit Number 6    Number of Visits 12    Date for PT Re-Evaluation 04/03/21    Authorization Type UHC    Authorization - Visit Number 6    Authorization - Number of Visits 12    Progress Note Due on Visit 10    PT Start Time 1005    PT Stop Time 1048    PT Time Calculation (min) 43 min    Activity Tolerance Patient tolerated treatment well    Behavior During Therapy WFL for tasks assessed/performed             Past Medical History:  Diagnosis Date   Anemia    2002 after having hysterectomy   Anxiety    Chronic pain    Depression    Fibromyalgia    GERD (gastroesophageal reflux disease)    History of kidney stones    Lupus (HCC)    Migraine headache    PONV (postoperative nausea and vomiting)    Sleep apnea    "Not enough for a CPAP".    Past Surgical History:  Procedure Laterality Date   APPENDECTOMY     BREAST CYST ASPIRATION Left    CARDIAC CATHETERIZATION     x 2 and both were normal   CHOLECYSTECTOMY     COLONOSCOPY WITH PROPOFOL N/A 11/05/2017   Procedure: COLONOSCOPY WITH PROPOFOL;  Surgeon: Rogene Houston,  MD;  [facility];  Service: Endoscopy;  Laterality: N/A;  8:30   complete hysterectomy     POLYPECTOMY  11/05/2017   Procedure: POLYPECTOMY;  Surgeon: Rogene Houston, MD;  [facility];  Service: Endoscopy;;  sigmoid   TONSILLECTOMY     TUBAL LIGATION      There were no vitals filed for this visit.   Subjective Assessment - 03/25/21 1013     Subjective Pt stated Sunday went Christmas shopping and reports increased ache for the last 2 nights.    Patient Stated Goals to be able to get out of the bed in the morning.    Currently in Pain? No/denies                [facility] PT Assessment - 03/25/21 0001       Assessment   Medical Diagnosis Sacroiliitis    Referring Provider (PT) Pieter Partridge Dawley DO    Onset Date/Surgical Date 04/15/20    Next MD Visit 03/25/21    Prior Therapy yes years ago      Precautions   Precautions None      Observation/Other Assessments   Focus on Therapeutic Outcomes (FOTO)  69% function  was 63% function     AROM   Lumbar Flexion 25% limited, pulling in back of legs no pain in back    Lumbar Extension WNL, pain free    Lumbar - Right Side Bend hand to knee, no pain   was 25% limited   Lumbar - Left Side Bend hand to knee, no pain   25% limited, pain/pulling   Lumbar - Right Rotation 10% limited, no pain   25% limited, pain/pulling   Lumbar - Left Rotation 10% limited, no pain   25% limited, pain/pulling     Strength   Right Hip Flexion 4/5   was 4/5   Right Hip Extension 4/5   was 3+   Right Hip ABduction 4/5   was 4-   Left Hip Flexion 4+/5   was 4/5   Left Hip Extension 4/5   was 4/5   Left Hip ABduction 4+/5   was 4/5                          [facility] Adult PT Treatment/Exercise - 03/25/21 0001       Lumbar Exercises: Stretches   Other Lumbar Stretch Exercise cat/camel 2 min    Other Lumbar Stretch Exercise downward dog 2x 30"      Lumbar Exercises: Standing   Functional Squats 15 reps    Functional  Squats Limitations 3D hip excursion      Lumbar Exercises: Supine   Bridge 10 reps;3 seconds      Lumbar Exercises: Quadruped   Opposite Arm/Leg Raise Right arm/Left leg;Left arm/Right leg;10 reps;5 seconds    Plank forearm plank 3x 10"                       PT Short Term Goals - 03/25/21 1029       PT SHORT TERM GOAL #1   Title Patient will be independent with HEP in order to improve functional outcomes.    Baseline 12/13:  Reports compliance with advanced HEP at least 4x/week    Status Achieved      PT SHORT TERM GOAL #2   Title Patient will report at least 25% improvement in symptoms for improved quality of life.    Baseline 03/25/21:  Reports improvements by 40%, reports improved sleeping    Status Achieved               PT Long Term Goals - 03/25/21 1030       PT LONG TERM GOAL #1   Title Patient will report at least 75% improvement in symptoms for improved quality of life.    Baseline 03/25/21:  Reports improvements by 40%, reports improved sleeping      PT LONG TERM GOAL #2   Title Patient will improve FOTO score by at least 15 points in order to indicate improved tolerance to activity.      PT LONG TERM GOAL #3   Title Patient will be able to sleep without increase in symptoms    Baseline 03/25/21:  Reports still waking 4-5 times a night due to pain    Status On-going                   Plan - 03/25/21 1058     Clinical Impression Statement Pt progressing well towards POC, reviewed goals prior MD apt tomorrow.  Progressed core stability and continued with lumbar and LE mobility exercises.  Pt able to  complete all exercises with good form with some cueing initially for form/mechanics.  No reports of pain through session, was limited by fatigue at EOS.    Personal Factors and Comorbidities Time since onset of injury/illness/exacerbation    Examination-Activity Limitations Bend;Carry;Lift;Locomotion Level;Stand;Squat     Examination-Participation Restrictions Cleaning;Community Activity;Driving;Laundry;Shop    Stability/Clinical Decision Making Stable/Uncomplicated    Clinical Decision Making Moderate    Rehab Potential Good    PT Frequency 2x / week    PT Duration 6 weeks    PT Treatment/Interventions Patient/family education;Therapeutic activities;Therapeutic exercise;Manual techniques;ADLs/Self Care Home Management;Aquatic Therapy;Moist Heat;Traction;Ultrasound;Gait training;Stair training;Functional mobility training;Balance training;Neuromuscular re-education;Orthotic Fit/Training;Compression bandaging;Scar mobilization;Passive range of motion;Dry needling;Splinting;Taping;Spinal Manipulations;Joint Manipulations    PT Next Visit Plan progress into core strength and pick up with old HEP as able    PT Home Exercise Plan 11/10 press up,, standing lumbar ext; 11/18: childs pose, cat/camel, quadruped UE/LE 12/6 hip ext, abduction; 12/8 child's pose, quad plank, hip IR/ER, bridge; 12/13: plank, downward dog    Consulted and Agree with Plan of Care Patient             Patient will benefit from skilled therapeutic intervention in order to improve the following deficits and impairments:  Decreased activity tolerance, Decreased balance, Decreased mobility, Decreased strength, Difficulty walking, Pain  Visit Diagnosis: Low back pain, unspecified back pain laterality, unspecified chronicity, unspecified whether sciatica present  Muscle weakness (generalized)  Other abnormalities of gait and mobility  Other symptoms and signs involving the musculoskeletal system     Problem List Patient Active Problem List   Diagnosis Date Noted   Special screening for malignant neoplasms, colon 09/16/2017   Abdominal pain, left lower quadrant 07/06/2012   Depression 07/06/2012   Lupus ([place]) 07/06/2012   Chronic pain syndrome 07/06/2012   FHx: migraine headaches 07/06/2012   KNEE PAIN 03/16/2007   BACK PAIN  03/16/2007   FIBROMYALGIA/FIBROMYOSITIS 03/16/2007   Ihor Austin, LPTA/CLT; CBIS [phone]  Aldona Lento, PTA 03/25/2021, 11:05 AM  [facility] [address] [place], [place], [address] Phone: [phone]   Fax:  [phone]  Name: [name] MRN: [id number] Date of Birth: 1960/06/12

## 2021-03-27 ENCOUNTER — Ambulatory Visit (HOSPITAL_COMMUNITY): Payer: 59 | Admitting: Physical Therapy

## 2021-04-01 ENCOUNTER — Ambulatory Visit (HOSPITAL_COMMUNITY): Payer: 59

## 2021-04-03 ENCOUNTER — Encounter (HOSPITAL_COMMUNITY): Payer: 59 | Admitting: Physical Therapy

## 2021-07-03 ENCOUNTER — Telehealth: Payer: Self-pay | Admitting: Neurology

## 2021-07-03 NOTE — Telephone Encounter (Signed)
Called patient and informed her that a refill cannot be sent until she is seen in office. Patient verbalized understanding and had no further questions or concerns. ?

## 2021-07-03 NOTE — Telephone Encounter (Signed)
Patient made an appt t see jaffe. She needs a refill on her Roselyn Meier. [facility] on [address] [place] ?

## 2021-07-06 NOTE — Progress Notes (Signed)
? ?NEUROLOGY FOLLOW UP OFFICE NOTE ? ?[place] ?[id number] ? ?IMPRESSION: ?Migraine without aura, without status migrainosus, not intractable ? ?PLAN: ? For preventative management, start Aimovig '140mg'$  every 28 days ? For abortive therapy, Roselyn Meier '100mg'$  ? Limit use of pain relievers to no more than 2 days out of week to prevent risk of rebound or medication-overuse headache. ? Keep headache diary ? Exercise, hydration, caffeine cessation, sleep hygiene, monitor for and avoid triggers ?Follow up 6 months. ? ?HISTORY OF PRESENT ILLNESS: ?Micha Erck is a 61 year old woman with migraines, fibromyalgia vs questionable lupus and depression who follows up for migraines. ?  ?UPDATE: ?Stopped propranolol because it caused paresthesias.  Not on a preventative.  Insurance denied Nurtec, so she was started on [place] which helped.  Headaches have increased a bit. ? ?CT C-spine on 03/17/2020 personally reviewed showed cervical spondylosis with mild bilateral facet arthropathy at C2-3, moderate left facet arthropathy at C4-5, and bilateral facet arthropathy at C6-7 ? ?Intensity:  severe ?Duration:  usually within an hour (rarely uses 2). ?Frequency:  at least 4 a month ?Frequency of abortive medication: nothing.   ?Current NSAIDS:  none ?Current analgesics:  Tylenol, tramadol (2 daily for fibromyalgia) ?Current triptans:  none ?Current ergotamine:  none ?Current anti-emetic:  none ?Current muscle relaxants:  none ?Current anti-anxiolytic:  clonazepam ?Current sleep aide:  none ?Current Antihypertensive medications:  none ?Current Antidepressant medications:  Lexapro '20mg'$  ?Current Anticonvulsant medications: gabapentin '100mg'$  TID ?Current anti-CGRP:  Ubrelvy '100mg'$  ?Current Vitamins/Herbal/Supplements:  D, E ?Current Antihistamines/Decongestants:  hydroxyzine ?Other therapy:  none ?Hormone/birth control:  estradiol ?  ?Caffeine:  6 oz coffee daily ?Diet:  Drinks at least 48-64 oz water daily.  No soda ?Exercise:  Not as  much as she should.   ?Depression:  yes; Anxiety:  yes ?Other pain:  Chronic pain ?Sleep hygiene:  varies ?  ?HISTORY:  ?Onset:  61 years old.  They have been worse over past few minutes.  She has been having a constant pressure headache on top ?Location:  Usually left sided and behind left eye.   ?Quality:  pounding ?Initial intensity:  Severe.  She denies severe or thunderclap headache ?Aura:  No ?Premonitory Phase:  Head pressure ?Postdrome:  Hangover effect for 2 to 3 days ?Associated symptoms:  Nausea, photophobia, phonophobia, blurred vision (more recent).  Sometimes vomiting.  She denies associated autonomic symptoms and unilateral numbness or weakness. ?Initial duration:  2 days ?Initial frequency:  3 a month ?Initial frequency of abortive medication: ibuprofen or naproxen at least once a day ?Triggers:  No ?Relieving factors:  Dark and quiet room ?Activity:  Aggravates. ?  ?She also has history of intermittent dizziness..  She describes it as a spinning sensation.  Spontaneous onset.  It typically lasts several days.  They occur once a month.  Movement aggravates it.  Some nausea.  Worse getting up in morning.  When walking, she feels off-balance. ?  ?  ?Past NSAIDS:  ibuprofen; naproxen ?Past analgesics:  Excedrin Migraine ?Past abortive triptans:  Maxalt; sumatriptan '100mg'$  ?Past abortive ergotamine:  none ?Past muscle relaxants:  none ?Past anti-emetic:  Phenergan, Zofran (effective) ?Past antihypertensive medications:  propranolol (paresthesias) ?Past antidepressant medications:  none ?Past anticonvulsant medications:  topiramate '50mg'$  at bedtime (side effects) ?Past anti-CGRP:  none ?Past vitamins/Herbal/Supplements:  none ?Past antihistamines/decongestants:  none ?Other past therapies:  none ?  ?Family history of headache:  Sister (migraines), brother (migraines) ?  ?MRI of brain without contrast from 07/19/18 showed very  mild nonspecific subcortical and periventricular punctate T2 and FLAIR hyperintense  foci. ? ?PAST MEDICAL HISTORY: ?Past Medical History:  ?Diagnosis Date  ? Anemia   ? 2002 after having hysterectomy  ? Anxiety   ? Chronic pain   ? Depression   ? Fibromyalgia   ? GERD (gastroesophageal reflux disease)   ? History of kidney stones   ? Lupus ([place])   ? Migraine headache   ? PONV (postoperative nausea and vomiting)   ? Sleep apnea   ? "Not enough for a CPAP".  ? ? ?MEDICATIONS: ?Current Outpatient Medications on File Prior to Visit  ?Medication Sig Dispense Refill  ? Cholecalciferol (VITAMIN D PO) Take 1 tablet by mouth daily.    ? escitalopram (LEXAPRO) 20 MG tablet Take 20 mg by mouth daily.    ? estradiol (ESTRACE) 2 MG tablet Take 2 mg by mouth daily.    ? gabapentin (NEURONTIN) 100 MG capsule TAKE 1 CAPSULE(100 MG) BY MOUTH THREE TIMES DAILY 90 capsule 2  ? HYDROcodone-acetaminophen (NORCO/VICODIN) 5-325 MG tablet Take 1 tablet by mouth every 6 (six) hours as needed for moderate pain. 30 tablet 0  ? hydrOXYzine (ATARAX/VISTARIL) 25 MG tablet Take 25 mg by mouth daily.    ? naproxen sodium (ALEVE) 220 MG tablet Take 220 mg by mouth 2 (two) times daily as needed (Pain). Reported on 06/07/2015    ? traMADol (ULTRAM) 50 MG tablet Take 50 mg by mouth every 6 (six) hours as needed for pain.    ? Ubrogepant (UBRELVY) 100 MG TABS Take 1 tablet by mouth as needed (May repeat in 2 hours.  Maximum 2 tablets in 24 hours.). 8 tablet 11  ? ?No current facility-administered medications on file prior to visit.  ? ? ?ALLERGIES: ?Allergies  ?Allergen Reactions  ? Penicillins Swelling and Other (See Comments)  ?  Blisters   ? ? ?FAMILY HISTORY: ?Family History  ?Problem Relation Age of Onset  ? Hypertension Mother   ? Diabetes Father   ? Hypertension Sister   ?     accidental death-fall, head injury  ? Cancer Brother   ?     lung  ? Hypertension Brother   ? Hypertension Sister   ? Hypertension Brother   ? Diabetes Brother   ? Hypertension Sister   ? Hypertension Brother   ? Other Son   ?     suicide  ? ?SOCIAL  HISTORY: ?Social History  ? ?Socioeconomic History  ? Marital status: Married  ?  Spouse name: Alvester Chou  ? Number of children: 2  ? Years of education: Not on file  ? Highest education level: 12th grade  ?Occupational History  ? Not on file  ?Tobacco Use  ? Smoking status: Never  ? Smokeless tobacco: Never  ?Vaping Use  ? Vaping Use: Never used  ?Substance and Sexual Activity  ? Alcohol use: No  ? Drug use: No  ? Sexual activity: Yes  ?  Birth control/protection: Surgical  ?Other Topics Concern  ? Not on file  ?Social History Narrative  ? Patient is right-handed. She lives with her husband in a one level home.. she drinks 6 oz of coffee a day. She walks 3 x a week for exercise.  ? ?Social Determinants of Health  ? ?Financial Resource Strain: Not on file  ?Food Insecurity: Not on file  ?Transportation Needs: Not on file  ?Physical Activity: Not on file  ?Stress: Not on file  ?Social Connections: Not on file  ?Intimate  Partner Violence: Not on file  ? ? ?PHYSICAL EXAM: ?Blood pressure 128/71, pulse 98, height '5\' 3"'$  (1.6 m), weight 159 lb 12.8 oz (72.5 kg), SpO2 97 %. ?General: No acute distress.  Patient appears well-groomed.   ?Head:  Normocephalic/atraumatic ?Eyes:  Fundi examined but not visualized ?Neck: supple, no paraspinal tenderness, full range of motion ?Heart:  Regular rate and rhythm ?Lungs:  Clear to auscultation bilaterally ?Back: No paraspinal tenderness ?Neurological Exam: alert and oriented to person, place, and time. Speech fluent and not dysarthric, language intact.  Right pupil slightly larger than left pupil (since childhood).  Otherwise, CN II-XII intact. Bulk and tone normal, muscle strength 5/5 throughout.  Sensation to light touch intact.  Deep tendon reflexes 2+ throughout, toes downgoing.  Finger to nose and heel to shin testing intact.  Gait normal, Romberg with sway. ? ? ?Metta Clines, DO ? ?CC: Redmond School, MD ? ? ?

## 2021-07-07 ENCOUNTER — Encounter: Payer: Self-pay | Admitting: Neurology

## 2021-07-07 ENCOUNTER — Other Ambulatory Visit: Payer: Self-pay

## 2021-07-07 ENCOUNTER — Ambulatory Visit: Payer: 59 | Admitting: Neurology

## 2021-07-07 VITALS — BP 128/71 | HR 98 | Ht 63.0 in | Wt 159.8 lb

## 2021-07-07 DIAGNOSIS — G43009 Migraine without aura, not intractable, without status migrainosus: Secondary | ICD-10-CM | POA: Diagnosis not present

## 2021-07-07 MED ORDER — UBRELVY 100 MG PO TABS
1.0000 | ORAL_TABLET | ORAL | 11 refills | Status: DC | PRN
Start: 2021-07-07 — End: 2022-01-08

## 2021-07-07 MED ORDER — AIMOVIG 140 MG/ML ~~LOC~~ SOAJ: 140.0000 mg | SUBCUTANEOUS | 5 refills | Status: DC

## 2021-07-07 NOTE — Patient Instructions (Signed)
Start Aimovig '140mg'$  every 28 days ?Take Roselyn Meier earliest onset of migraine.  May repeat after 2 hours.  Maximum 2 tablets in 24 hours. ?Keep headache diary ?Follow up 6 months. ?

## 2021-07-10 ENCOUNTER — Telehealth: Payer: Self-pay

## 2021-07-10 ENCOUNTER — Other Ambulatory Visit (HOSPITAL_COMMUNITY): Payer: Self-pay

## 2021-07-10 NOTE — Telephone Encounter (Addendum)
Patient Advocate Encounter ?  ?Received notification from [facility] that prior authorization for Ubrelvy '100mg'$  tabs is required by his/her insurance [employer]. ?  ?PA submitted on 07/10/21 ? ?Key#: [id number] ? ?PA approved - $50 copay ?   ?[facility] will continue to follow: ? ?Patient Advocate ?Fax: [phone]  ?

## 2021-07-14 NOTE — Telephone Encounter (Signed)
Patient Advocate Encounter ? ?Prior Authorization for [name] '100MG'$  tablets has been approved.   ? ?PA# [id number] ?Effective dates: 07/10/2021 through 07/11/2022 ? ? ? ? ? ?Lyndel Safe, CPhT ?Pharmacy Patient Advocate Specialist ?[facility] Patient Advocate Team ?Direct Number: [phone]  Fax: [phone]  ?

## 2021-07-22 ENCOUNTER — Telehealth (HOSPITAL_COMMUNITY): Payer: Self-pay | Admitting: Pharmacy Technician

## 2021-07-22 ENCOUNTER — Other Ambulatory Visit (HOSPITAL_COMMUNITY): Payer: Self-pay

## 2021-07-22 NOTE — Telephone Encounter (Signed)
Patient Advocate Encounter ?  ?Received notification that prior authorization for Aimovig '140MG'$ /ML auto-injectors is required. ?  ?PA submitted on 07/22/2021 ?Key [id number] ?Status is pending ?   ? ? ? ?Lyndel Safe, CPhT ?Pharmacy Patient Advocate Specialist ?[facility] Patient Advocate Team ?Direct Number: [phone]  Fax: [phone]  ?

## 2021-07-23 ENCOUNTER — Other Ambulatory Visit (HOSPITAL_COMMUNITY): Payer: Self-pay

## 2021-08-04 ENCOUNTER — Ambulatory Visit: Payer: 59 | Admitting: Neurology

## 2021-12-05 ENCOUNTER — Other Ambulatory Visit (HOSPITAL_COMMUNITY): Payer: Self-pay | Admitting: Internal Medicine

## 2021-12-05 DIAGNOSIS — M79604 Pain in right leg: Secondary | ICD-10-CM

## 2021-12-24 ENCOUNTER — Telehealth: Payer: Self-pay | Admitting: Pharmacy Technician

## 2021-12-24 NOTE — Telephone Encounter (Signed)
Patient Advocate Encounter   Received notification that prior authorization for Aimovig '140MG'$ /ML auto-injectors is required.   PA submitted on 12/24/2021 Key [id number] Status is pending       Lyndel Safe, [place] Patient Advocate Specialist [place] Patient Advocate Team Direct Number: [phone]  Fax: [phone]

## 2021-12-25 NOTE — Telephone Encounter (Signed)
Patient Advocate Encounter  Prior Authorization for Aimovig '140MG'$ /ML auto-injectors  has been approved.    PA# [id number] Effective dates: 12/24/2021 through 12/25/2022    Lyndel Safe, [place] Patient Advocate Specialist [place] Patient Advocate Team Direct Number: [phone]  Fax: [phone]

## 2022-01-06 NOTE — Progress Notes (Unsigned)
Virtual Visit via Video Note  Consent was obtained for video visit:  Yes.   Answered questions that patient had about telehealth interaction:  Yes.   I discussed the limitations, risks, security and privacy concerns of performing an evaluation and management service by telemedicine. I also discussed with the patient that there may be a patient responsible charge related to this service. The patient expressed understanding and agreed to proceed.  Pt location: Home Physician Location: office Name of referring provider:  Redmond School, MD I connected with [place] at patients initiation/request on 01/08/2022 at 10:50 AM EDT by video enabled telemedicine application and verified that I am speaking with the correct person using two identifiers. Pt MRN:  [id number] Pt DOB:  1960-12-07 Video Participants:  [name];  Assessment and Plan:   IMPRESSION: Migraine without aura, without status migrainosus, not intractable - improved  PLAN:  For preventative management, Aimovig 182m every 28 days  For abortive therapy, Ubrelvy 1025m Limit use of pain relievers to no more than 2 days out of week to prevent risk of rebound or medication-overuse headache.  Keep headache diary  Exercise, hydration, caffeine cessation, sleep hygiene, monitor for and avoid triggers Follow up 6 months.  History of Present Illness:  CaEverlean Buchers a [age]ear old woman with migraines, fibromyalgia vs questionable lupus and depression who follows up for migraines.   UPDATE: Last visit, started Aimovig.  Improved.  Less frequent migraines. Intensity:  severe Duration:  usually within an hour (rarely uses 2). Frequency:  2 a month Frequency of abortive medication: nothing.   Current NSAIDS:  none Current analgesics:  Tylenol, tramadol (2 daily for fibromyalgia) Current triptans:  none Current ergotamine:  none Current anti-emetic:  none Current muscle relaxants:  none Current anti-anxiolytic:   clonazepam Current sleep aide:  none Current Antihypertensive medications:  none Current Antidepressant medications:  Lexapro 201murrent Anticonvulsant medications: gabapentin 100m77mD Current anti-CGRP:  Aimovig 140mg21mrelvy 100mg 12ment Vitamins/Herbal/Supplements:  D, E Current Antihistamines/Decongestants:  hydroxyzine Other therapy:  none Hormone/birth control:  estradiol   Caffeine:  6 oz coffee daily Diet:  Drinks at least 48-64 oz water daily.  No soda Exercise:  Not as much as she should.   Depression:  yes; Anxiety:  yes Other pain:  Chronic pain Sleep hygiene:  varies   HISTORY:  Onset:  16 yea68 old.  They have been worse over past few minutes.  She has been having a constant pressure headache on top Location:  Usually left sided and behind left eye.   Quality:  pounding Initial intensity:  Severe.  She denies severe or thunderclap headache Aura:  No Premonitory Phase:  Head pressure Postdrome:  Hangover effect for 2 to 3 days Associated symptoms:  Nausea, photophobia, phonophobia, blurred vision (more recent).  Sometimes vomiting.  She denies associated autonomic symptoms and unilateral numbness or weakness. Initial duration:  2 days Initial frequency:  4-5 a month Initial frequency of abortive medication: ibuprofen or naproxen at least once a day Triggers:  No Relieving factors:  Dark and quiet room Activity:  Aggravates.   She also has history of intermittent dizziness..  She describes it as a spinning sensation.  Spontaneous onset.  It typically lasts several days.  They occur once a month.  Movement aggravates it.  Some nausea.  Worse getting up in morning.  When walking, she feels off-balance.     Past NSAIDS:  ibuprofen; naproxen Past analgesics:  Excedrin Migraine Past  abortive triptans:  Maxalt; sumatriptan 172m Past abortive ergotamine:  none Past muscle relaxants:  none Past anti-emetic:  Phenergan, Zofran (effective) Past antihypertensive  medications:  propranolol (paresthesias) Past antidepressant medications:  none Past anticonvulsant medications:  topiramate 594mat bedtime (side effects) Past anti-CGRP:  none Past vitamins/Herbal/Supplements:  none Past antihistamines/decongestants:  none Other past therapies:  none   Family history of headache:  Sister (migraines), brother (migraines)   MRI of brain without contrast from 07/19/18 showed very mild nonspecific subcortical and periventricular punctate T2 and FLAIR hyperintense foci.  CT C-spine on 03/17/2020 showed cervical spondylosis with mild bilateral facet arthropathy at C2-3, moderate left facet arthropathy at C4-5, and bilateral facet arthropathy at C6-7   Past Medical History: Past Medical History:  Diagnosis Date   Anemia    2002 after having hysterectomy   Anxiety    Chronic pain    Depression    Fibromyalgia    GERD (gastroesophageal reflux disease)    History of kidney stones    Lupus (HCC)    Migraine headache    PONV (postoperative nausea and vomiting)    Sleep apnea    "Not enough for a CPAP".    Medications: Outpatient Encounter Medications as of 01/08/2022  Medication Sig   Cholecalciferol (VITAMIN D PO) Take 1 tablet by mouth daily.   Cyanocobalamin (B-12 COMPLIANCE INJECTION) 1000 MCG/ML KIT Inject as directed. Once a month   Erenumab-aooe (AIMOVIG) 140 MG/ML SOAJ Inject 140 mg into the skin every 28 (twenty-eight) days.   escitalopram (LEXAPRO) 20 MG tablet Take 20 mg by mouth daily.   estradiol (ESTRACE) 2 MG tablet Take 2 mg by mouth daily.   gabapentin (NEURONTIN) 100 MG capsule TAKE 1 CAPSULE(100 MG) BY MOUTH THREE TIMES DAILY   hydrOXYzine (ATARAX/VISTARIL) 25 MG tablet Take 25 mg by mouth daily.   naproxen sodium (ALEVE) 220 MG tablet Take 220 mg by mouth 2 (two) times daily as needed (Pain). Reported on 06/07/2015   Prenatal Vit-Fe Fumarate-FA (PRENATAL MULTIVITAMIN) TABS tablet Take 1 tablet by mouth daily at 12 noon.   traMADol  (ULTRAM) 50 MG tablet Take 50 mg by mouth every 6 (six) hours as needed for pain.   Ubrogepant (UBRELVY) 100 MG TABS Take 1 tablet by mouth as needed (May repeat in 2 hours.  Maximum 2 tablets in 24 hours.).   [DISCONTINUED] Erenumab-aooe (AIMOVIG) 140 MG/ML SOAJ Inject 140 mg into the skin every 28 (twenty-eight) days.   [DISCONTINUED] Ubrogepant (UBRELVY) 100 MG TABS Take 1 tablet by mouth as needed (May repeat in 2 hours.  Maximum 2 tablets in 24 hours.).   No facility-administered encounter medications on file as of 01/08/2022.    Allergies: Allergies  Allergen Reactions   Penicillins Swelling and Other (See Comments)    Blisters     Family History: Family History  Problem Relation Age of Onset   Hypertension Mother    Diabetes Father    Hypertension Sister        accidental death-fall, head injury   Cancer Brother        lung   Hypertension Brother    Hypertension Sister    Hypertension Brother    Diabetes Brother    Hypertension Sister    Hypertension Brother    Other Son        suicide    Observations/Objective:   There were no vitals taken for this visit. No acute distress.  Alert and oriented.  Speech fluent and not dysarthric.  Language intact.     Follow Up Instructions:    -I discussed the assessment and treatment plan with the patient. The patient was provided an opportunity to ask questions and all were answered. The patient agreed with the plan and demonstrated an understanding of the instructions.   The patient was advised to call back or seek an in-person evaluation if the symptoms worsen or if the condition fails to improve as anticipated.   Dudley Major, DO

## 2022-01-07 ENCOUNTER — Ambulatory Visit (HOSPITAL_COMMUNITY): Payer: 59

## 2022-01-07 ENCOUNTER — Encounter (HOSPITAL_COMMUNITY): Payer: Self-pay

## 2022-01-08 ENCOUNTER — Encounter: Payer: Self-pay | Admitting: Neurology

## 2022-01-08 ENCOUNTER — Telehealth: Payer: 59 | Admitting: Neurology

## 2022-01-08 DIAGNOSIS — G43009 Migraine without aura, not intractable, without status migrainosus: Secondary | ICD-10-CM

## 2022-01-08 MED ORDER — AIMOVIG 140 MG/ML ~~LOC~~ SOAJ
140.0000 mg | SUBCUTANEOUS | 11 refills | Status: DC
Start: 2022-01-08 — End: 2022-07-09

## 2022-01-08 MED ORDER — UBRELVY 100 MG PO TABS
1.0000 | ORAL_TABLET | ORAL | 11 refills | Status: AC | PRN
Start: 2022-01-08 — End: ?

## 2022-03-27 ENCOUNTER — Other Ambulatory Visit (HOSPITAL_COMMUNITY): Payer: Self-pay | Admitting: Internal Medicine

## 2022-03-27 DIAGNOSIS — Z136 Encounter for screening for cardiovascular disorders: Secondary | ICD-10-CM

## 2022-04-29 ENCOUNTER — Ambulatory Visit (HOSPITAL_COMMUNITY): Payer: 59 | Attending: Internal Medicine

## 2022-04-29 ENCOUNTER — Encounter (HOSPITAL_COMMUNITY): Payer: Self-pay

## 2022-06-11 ENCOUNTER — Encounter: Payer: Self-pay | Admitting: Radiology

## 2022-06-24 ENCOUNTER — Other Ambulatory Visit (HOSPITAL_COMMUNITY): Payer: Self-pay

## 2022-07-08 NOTE — Progress Notes (Unsigned)
NEUROLOGY FOLLOW UP OFFICE NOTE  ANANA DEYTON IP:[id number]  Assessment/Plan:   Migraine without aura, without status migrainosus, not intractable Chronic tension type headache with cervical myofascial pain    PLAN:  For preventative management, Aimovig 140mg  every 28 days.  Likely needs prior authorization  For abortive therapy, Ubrelvy 100mg   For neck pain, cyclobenzaprine 10mg  at bedtime, take 1-2 times during the day if needed (caution for drowsiness).  If pain persists, she will contact us for referral to physical therapy  Limit use of pain relievers to no more than 2 days out of week to prevent risk of rebound or medication-overuse headache.  Keep headache diary  Follow up 6 months.  Subjective:  Teiana Stumpe is a 62 year old woman with migraines, fibromyalgia vs questionable lupus and depression who follows up for migraines.   UPDATE: Has not taken Aimovig since December due to "insurance issues".  Migraines remain infrequent (once or twice a month, 1 hour with Ubrelvy) but has tension-type headaches (from neck and into face) daily.  She has been under a great deal of stress.  Her niece has been in the hospital since January due to sepsis and required amputation.  Significant neck pain.  Treats with Tylenol or ibuprofen daily for about a month.  Chiropractor helped for a bit but it was expensive and had to stop.  She still does neck exercises at home.    Intensity:  severe Duration:  usually within an hour (rarely uses 2). Frequency:  2 a month Frequency of abortive medication: nothing.   Current NSAIDS:  none Current analgesics:  Tylenol, tramadol (2 daily for fibromyalgia) Current triptans:  none Current ergotamine:  none Current anti-emetic:  none Current muscle relaxants:  none Current anti-anxiolytic:  clonazepam Current sleep aide:  none Current Antihypertensive medications:  none Current Antidepressant medications:  Lexapro 20mg  Current Anticonvulsant  medications: gabapentin 100mg  TID Current anti-CGRP:  Ubrelvy 100mg , Aimovig 140mg  Current Vitamins/Herbal/Supplements:  D, E Current Antihistamines/Decongestants:  hydroxyzine Other therapy:  none Hormone/birth control:  estradiol   Caffeine:  6 oz coffee daily Diet:  Drinks at least 48-64 oz water daily.  No soda Exercise:  Not as much as she should.   Depression:  yes; Anxiety:  yes Other pain:  Chronic pain Sleep hygiene:  varies   HISTORY:  Onset:  62 years old.  They have been worse over past few minutes.  She has been having a constant pressure headache on top Location:  Usually left sided and behind left eye.   Quality:  pounding Initial intensity:  Severe.  She denies severe or thunderclap headache Aura:  No Premonitory Phase:  Head pressure Postdrome:  Hangover effect for 2 to 3 days Associated symptoms:  Nausea, photophobia, phonophobia, blurred vision (more recent).  Sometimes vomiting.  She denies associated autonomic symptoms and unilateral numbness or weakness. Initial duration:  2 days Initial frequency:  4-5 a month Initial frequency of abortive medication: ibuprofen or naproxen at least once a day Triggers:  No Relieving factors:  Dark and quiet room Activity:  Aggravates.   She also has history of intermittent dizziness..  She describes it as a spinning sensation.  Spontaneous onset.  It typically lasts several days.  They occur once a month.  Movement aggravates it.  Some nausea.  Worse getting up in morning.  When walking, she feels off-balance.     Past NSAIDS:  ibuprofen; naproxen Past analgesics:  Excedrin Migraine Past abortive triptans:  Maxalt; sumatriptan 100mg  Past  abortive ergotamine:  none Past muscle relaxants:  none Past anti-emetic:  Phenergan, Zofran (effective) Past antihypertensive medications:  propranolol (paresthesias) Past antidepressant medications:  none Past anticonvulsant medications:  topiramate 50mg  at bedtime (side  effects) Past anti-CGRP:  none Past vitamins/Herbal/Supplements:  none Past antihistamines/decongestants:  none Other past therapies:  none   Family history of headache:  Sister (migraines), brother (migraines)   MRI of brain without contrast from 07/19/18 showed very mild nonspecific subcortical and periventricular punctate T2 and FLAIR hyperintense foci.   CT C-spine on 03/17/2020 showed cervical spondylosis with mild bilateral facet arthropathy at C2-3, moderate left facet arthropathy at C4-5, and bilateral facet arthropathy at C6-7  PAST MEDICAL HISTORY: Past Medical History:  Diagnosis Date   Anemia    2002 after having hysterectomy   Anxiety    Chronic pain    Depression    Fibromyalgia    GERD (gastroesophageal reflux disease)    History of kidney stones    Lupus (HCC)    Migraine headache    PONV (postoperative nausea and vomiting)    Sleep apnea    "Not enough for a CPAP".    MEDICATIONS: Current Outpatient Medications on File Prior to Visit  Medication Sig Dispense Refill   Cholecalciferol (VITAMIN D PO) Take 1 tablet by mouth daily.     Cyanocobalamin (B-12 COMPLIANCE INJECTION) 1000 MCG/ML KIT Inject as directed. Once a month     Erenumab-aooe (AIMOVIG) 140 MG/ML SOAJ Inject 140 mg into the skin every 28 (twenty-eight) days. 1.12 mL 11   escitalopram (LEXAPRO) 20 MG tablet Take 20 mg by mouth daily.     estradiol (ESTRACE) 2 MG tablet Take 2 mg by mouth daily.     gabapentin (NEURONTIN) 100 MG capsule TAKE 1 CAPSULE(100 MG) BY MOUTH THREE TIMES DAILY 90 capsule 2   hydrOXYzine (ATARAX/VISTARIL) 25 MG tablet Take 25 mg by mouth daily.     naproxen sodium (ALEVE) 220 MG tablet Take 220 mg by mouth 2 (two) times daily as needed (Pain). Reported on 06/07/2015     Prenatal Vit-Fe Fumarate-FA (PRENATAL MULTIVITAMIN) TABS tablet Take 1 tablet by mouth daily at 12 noon.     traMADol (ULTRAM) 50 MG tablet Take 50 mg by mouth every 6 (six) hours as needed for pain.      Ubrogepant (UBRELVY) 100 MG TABS Take 1 tablet by mouth as needed (May repeat in 2 hours.  Maximum 2 tablets in 24 hours.). 8 tablet 11   No current facility-administered medications on file prior to visit.    ALLERGIES: Allergies  Allergen Reactions   Penicillins Swelling and Other (See Comments)    Blisters     FAMILY HISTORY: Family History  Problem Relation Age of Onset   Hypertension Mother    Diabetes Father    Hypertension Sister        accidental death-fall, head injury   Cancer Brother        lung   Hypertension Brother    Hypertension Sister    Hypertension Brother    Diabetes Brother    Hypertension Sister    Hypertension Brother    Other Son        suicide      Objective:  Blood pressure 137/83, pulse 88, height 5\' 4"  (1.626 m), weight 162 lb 12.8 oz (73.8 kg), SpO2 98 %. General: No acute distress.  Patient appears well-groomed.   Head:  Normocephalic/atraumatic Eyes:  Fundi examined but not visualized Neck: supple, left sided  paraspinal tenderness, decreased side bend and neck turn to left. Heart:  Regular rate and rhythm Neurological Exam: alert and oriented to person, place, and time.  Speech fluent and not dysarthric, language intact.  CN II-XII intact. Bulk and tone normal, muscle strength 5/5 throughout.  Sensation to light touch intact.  Deep tendon reflexes 2+ throughout, toes downgoing.  Finger to nose testing intact.  Gait normal, Romberg negative.   Metta Clines, DO  CC: Redmond School, MD

## 2022-07-09 ENCOUNTER — Encounter: Payer: Self-pay | Admitting: Neurology

## 2022-07-09 ENCOUNTER — Telehealth: Payer: Self-pay

## 2022-07-09 ENCOUNTER — Other Ambulatory Visit (HOSPITAL_COMMUNITY): Payer: Self-pay

## 2022-07-09 ENCOUNTER — Ambulatory Visit: Payer: 59 | Admitting: Neurology

## 2022-07-09 VITALS — BP 137/83 | HR 88 | Ht 64.0 in | Wt 162.8 lb

## 2022-07-09 DIAGNOSIS — G43009 Migraine without aura, not intractable, without status migrainosus: Secondary | ICD-10-CM

## 2022-07-09 DIAGNOSIS — G44229 Chronic tension-type headache, not intractable: Secondary | ICD-10-CM

## 2022-07-09 DIAGNOSIS — M7918 Myalgia, other site: Secondary | ICD-10-CM

## 2022-07-09 MED ORDER — AIMOVIG 140 MG/ML ~~LOC~~ SOAJ: 140.0000 mg | SUBCUTANEOUS | 11 refills | Status: DC

## 2022-07-09 MED ORDER — CYCLOBENZAPRINE HCL 10 MG PO TABS: 10.0000 mg | ORAL_TABLET | Freq: Three times a day (TID) | ORAL | 5 refills | Status: DC | PRN

## 2022-07-09 NOTE — Telephone Encounter (Signed)
Patient seen in office today, renew Aimovig today.

## 2022-07-09 NOTE — Telephone Encounter (Signed)
Patient advised of approval.  

## 2022-07-09 NOTE — Telephone Encounter (Signed)
Ran test claim-PA on file is still valid until 12/2022:

## 2022-07-09 NOTE — Patient Instructions (Addendum)
Start cyclobenzaprine 10mg  at bedtime (may take 1 or 2 times during day if needed - caution for drowsiness) Aimovig Ubrelvy Limit use of pain relievers to no more than 2 days out of week to prevent risk of rebound or medication-overuse headache. If you want physical therapy, contact me Otherwise follow up 6 months.

## 2022-08-03 ENCOUNTER — Telehealth: Payer: Self-pay

## 2022-08-03 NOTE — Telephone Encounter (Signed)
PA needed for Ubrelvy 100 mg 

## 2022-08-11 ENCOUNTER — Other Ambulatory Visit (HOSPITAL_COMMUNITY): Payer: Self-pay

## 2022-08-11 ENCOUNTER — Telehealth: Payer: Self-pay | Admitting: Pharmacy Technician

## 2022-08-11 NOTE — Telephone Encounter (Signed)
PA has been submitted EXPEDITED, and telephone encounter has been created.  

## 2022-08-11 NOTE — Telephone Encounter (Signed)
Patient Advocate Encounter  Received notification from [employer] that prior authorization for UBRELVY 100MG  is required.   PA submitted on 4.30.24 Key [id number] Status is pending

## 2022-08-18 NOTE — Telephone Encounter (Signed)
Patient Advocate Encounter  Prior Authorization for Ubrelvy 100MG  tab has been approved through [employer].    Key [id number]   Effective: 08-18-2022 to 08-11-2023

## 2023-01-01 NOTE — Progress Notes (Signed)
NEUROLOGY FOLLOW UP OFFICE NOTE  [name] [id number]  Assessment/Plan:   Migraine without aura, without status migrainosus, not intractable Tension-type headache with cervical myofascial pain  Probable left TMJ dysfunction which may be contributing to headaches as well   PLAN:  For preventative management, Aimovig 140mg  every 28 days.   For abortive therapy, Ubrelvy 100mg    For neck pain, cyclobenzaprine 10mg  at bedtime, take 1-2 times during the day if needed (caution for drowsiness).  If pain persists, she will contact us for referral to physical therapy  Limit use of pain relievers to no more than 2 days out of week to prevent risk of rebound or medication-overuse headache  Keep headache diary Follow up with dentist regarding teeth grinding/left TMJ  Follow up 1 year  Subjective:  [name] is a 62 year old woman with migraines, fibromyalgia vs questionable lupus and depression who follows up for migraines.   UPDATE: Restarted Aimovig. Doing well Intensity:  severe Duration:  usually within an hour with Bernita Raisin (rarely uses 2).  Frequency:  1 to 2 a month  Tension headache and neck pain improved but not resolved.  She grinds her teeth.  Frequency of abortive medication: nothing.   Current NSAIDS:  none Current analgesics:  Tylenol, tramadol (2 daily for fibromyalgia) Current triptans:  none Current ergotamine:  none Current anti-emetic:  none Current muscle relaxants:  cyclobenzaprine 10mg  1-2 times during day PRN and at bedtime Current anti-anxiolytic:  clonazepam Current sleep aide:  none Current Antihypertensive medications:  none Current Antidepressant medications:  Lexapro 20mg  Current Anticonvulsant medications: gabapentin 100mg  TID Current anti-CGRP:  Ubrelvy 100mg , Aimovig 140mg  Current Vitamins/Herbal/Supplements:  D, E Current Antihistamines/Decongestants:  hydroxyzine Other therapy:  none Hormone/birth control:  estradiol   Caffeine:  6 oz  coffee daily Diet:  Drinks at least 48-64 oz water daily.  No soda Exercise:  Not as much as she should.   Depression:  yes; Anxiety:  yes Other pain:  Chronic pain Sleep hygiene:  varies   HISTORY:  Onset:  62 years old.  They have been worse over past few minutes.  She has been having a constant pressure headache on top Location:  Usually left sided and behind left eye.   Quality:  pounding Initial intensity:  Severe.  She denies severe or thunderclap headache Aura:  No Premonitory Phase:  Head pressure Postdrome:  Hangover effect for 2 to 3 days Associated symptoms:  Nausea, photophobia, phonophobia, blurred vision (more recent).  Sometimes vomiting.  She denies associated autonomic symptoms and unilateral numbness or weakness. Initial duration:  2 days Initial frequency:  4-5 a month Initial frequency of abortive medication: ibuprofen or naproxen at least once a day Triggers:  No Relieving factors:  Dark and quiet room Activity:  Aggravates.   She also has history of intermittent dizziness..  She describes it as a spinning sensation.  Spontaneous onset.  It typically lasts several days.  They occur once a month.  Movement aggravates it.  Some nausea.  Worse getting up in morning.  When walking, she feels off-balance.     Past NSAIDS:  ibuprofen; naproxen Past analgesics:  Excedrin Migraine Past abortive triptans:  Maxalt; sumatriptan 100mg  Past abortive ergotamine:  none Past muscle relaxants:  none Past anti-emetic:  Phenergan, Zofran (effective) Past antihypertensive medications:  propranolol (paresthesias) Past antidepressant medications:  none Past anticonvulsant medications:  topiramate 50mg  at bedtime (side effects) Past anti-CGRP:  none Past vitamins/Herbal/Supplements:  none Past antihistamines/decongestants:  none Other past  therapies:  none   Family history of headache:  Sister (migraines), brother (migraines)   MRI of brain without contrast from 07/19/18 showed  very mild nonspecific subcortical and periventricular punctate T2 and FLAIR hyperintense foci.   CT C-spine on 03/17/2020 showed cervical spondylosis with mild bilateral facet arthropathy at C2-3, moderate left facet arthropathy at C4-5, and bilateral facet arthropathy at C6-7  PAST MEDICAL HISTORY: Past Medical History:  Diagnosis Date   Anemia    2002 after having hysterectomy   Anxiety    Chronic pain    Depression    Fibromyalgia    GERD (gastroesophageal reflux disease)    History of kidney stones    Lupus (HCC)    Migraine headache    PONV (postoperative nausea and vomiting)    Sleep apnea    "Not enough for a CPAP".    MEDICATIONS: Current Outpatient Medications on File Prior to Visit  Medication Sig Dispense Refill   Cholecalciferol (VITAMIN D PO) Take 1 tablet by mouth daily.     Cyanocobalamin (B-12 COMPLIANCE INJECTION) 1000 MCG/ML KIT Inject as directed. Once a month     cyclobenzaprine (FLEXERIL) 10 MG tablet Take 1 tablet (10 mg total) by mouth 3 (three) times daily as needed for muscle spasms. 90 tablet 5   Erenumab-aooe (AIMOVIG) 140 MG/ML SOAJ Inject 140 mg into the skin every 28 (twenty-eight) days. 1.12 mL 11   escitalopram (LEXAPRO) 20 MG tablet Take 20 mg by mouth daily.     estradiol (ESTRACE) 2 MG tablet Take 2 mg by mouth daily.     hydrOXYzine (ATARAX/VISTARIL) 25 MG tablet Take 25 mg by mouth daily.     naproxen sodium (ALEVE) 220 MG tablet Take 220 mg by mouth 2 (two) times daily as needed (Pain). Reported on 06/07/2015     Prenatal Vit-Fe Fumarate-FA (PRENATAL MULTIVITAMIN) TABS tablet Take 1 tablet by mouth daily at 12 noon.     traMADol (ULTRAM) 50 MG tablet Take 50 mg by mouth every 6 (six) hours as needed for pain.     Ubrogepant (UBRELVY) 100 MG TABS Take 1 tablet by mouth as needed (May repeat in 2 hours.  Maximum 2 tablets in 24 hours.). 8 tablet 11   No current facility-administered medications on file prior to visit.    ALLERGIES: Allergies   Allergen Reactions   Penicillins Swelling and Other (See Comments)    Blisters     FAMILY HISTORY: Family History  Problem Relation Age of Onset   Hypertension Mother    Diabetes Father    Hypertension Sister        accidental death-fall, head injury   Cancer Brother        lung   Hypertension Brother    Hypertension Sister    Hypertension Brother    Diabetes Brother    Hypertension Sister    Hypertension Brother    Other Son        suicide      Objective:  Blood pressure 124/74, pulse 77, height 5\' 3"  (1.6 m), weight 171 lb 9.6 oz (77.8 kg), SpO2 97%. General: No acute distress.  Patient appears well-groomed.   Head:  Normocephalic/atraumatic.  Tenderness to palpation of left TMJ Eyes:  Fundi examined but not visualized Neck: supple, left sided paraspinal tenderness, decreased side bend and neck turn to left. Heart:  Regular rate and rhythm Neurological Exam: alert and oriented.  Speech fluent and not dysarthric, language intact.  Right pupil slightly larger than left  pupil.  Otherwise, CN II-XII intact. Bulk and tone normal, muscle strength 5/5 throughout.  Sensation to light touch intact.  Deep tendon reflexes 2+ throughout.  Finger to nose testing intact.  Gait normal, Romberg negative.    Shon Millet, DO  CC: Elfredia Nevins, MD

## 2023-01-11 ENCOUNTER — Ambulatory Visit: Payer: 59 | Admitting: Neurology

## 2023-01-11 ENCOUNTER — Encounter: Payer: Self-pay | Admitting: Neurology

## 2023-01-11 VITALS — BP 124/74 | HR 77 | Ht 63.0 in | Wt 171.6 lb

## 2023-01-11 DIAGNOSIS — M7918 Myalgia, other site: Secondary | ICD-10-CM | POA: Diagnosis not present

## 2023-01-11 DIAGNOSIS — G43009 Migraine without aura, not intractable, without status migrainosus: Secondary | ICD-10-CM

## 2023-01-11 DIAGNOSIS — M26609 Unspecified temporomandibular joint disorder, unspecified side: Secondary | ICD-10-CM

## 2023-01-11 MED ORDER — AIMOVIG 140 MG/ML ~~LOC~~ SOAJ
140.0000 mg | SUBCUTANEOUS | 11 refills | Status: DC
Start: 2023-01-11 — End: 2024-01-19

## 2023-01-11 MED ORDER — UBRELVY 100 MG PO TABS: 1.0000 | ORAL_TABLET | ORAL | 11 refills | Status: DC | PRN

## 2023-01-11 MED ORDER — CYCLOBENZAPRINE HCL 10 MG PO TABS
10.0000 mg | ORAL_TABLET | Freq: Three times a day (TID) | ORAL | 11 refills | Status: DC | PRN
Start: 2023-01-11 — End: 2023-03-10

## 2023-01-11 NOTE — Patient Instructions (Signed)
Continue Aimovig Ubrelvy as needed Flexeril Follow up with dentist about the left TMJ/jaw pain Follow up 1 year

## 2023-01-14 ENCOUNTER — Telehealth: Payer: Self-pay

## 2023-01-14 NOTE — Telephone Encounter (Signed)
PA needed for Aimovig 140 mg 

## 2023-02-11 ENCOUNTER — Other Ambulatory Visit (HOSPITAL_COMMUNITY): Payer: Self-pay

## 2023-02-11 ENCOUNTER — Telehealth: Payer: Self-pay | Admitting: Pharmacy Technician

## 2023-02-11 NOTE — Telephone Encounter (Signed)
Pharmacy Patient Advocate Encounter   Received notification from Pt Calls Messages that prior authorization for Aimovig is required/requested.   Insurance verification completed.   The patient is insured through [facility] .   Per test claim: PA required; PA submitted to above mentioned insurance via CoverMyMeds Key/confirmation #/EOC [id number] Status is pending

## 2023-02-11 NOTE — Telephone Encounter (Signed)
Pharmacy Patient Advocate Encounter  Received notification from [facility] that Prior Authorization for Aimovig has been APPROVED from 02/11/23 to 02/11/24. Ran test claim, Copay is $50. This test claim was processed through [facility]- copay amounts may vary at other pharmacies due to pharmacy/plan contracts, or as the patient moves through the different stages of their insurance plan.   PA #/Case ID/Reference #: PA Case ID #: [id number]

## 2023-02-11 NOTE — Telephone Encounter (Signed)
PA request has been Submitted. New Encounter created for follow up. For additional info see Pharmacy Prior Auth telephone encounter from 10/31.

## 2023-03-10 ENCOUNTER — Other Ambulatory Visit: Payer: Self-pay | Admitting: Neurology

## 2023-03-31 ENCOUNTER — Other Ambulatory Visit (HOSPITAL_COMMUNITY): Payer: Self-pay | Admitting: Internal Medicine

## 2023-03-31 DIAGNOSIS — R1013 Epigastric pain: Secondary | ICD-10-CM

## 2023-07-13 ENCOUNTER — Other Ambulatory Visit (HOSPITAL_COMMUNITY): Payer: Self-pay

## 2023-07-13 ENCOUNTER — Telehealth: Payer: Self-pay | Admitting: Pharmacist

## 2023-07-13 NOTE — Telephone Encounter (Signed)
 Pharmacy Patient Advocate Encounter   Received notification from CoverMyMeds that prior authorization for Ubrelvy 100MG  tablets is required/requested.   Insurance verification completed.   The patient is insured through [facility] .   Per test claim: PA required; PA submitted to above mentioned insurance via CoverMyMeds Key/confirmation #/EOC [id number] Status is pending

## 2023-07-14 ENCOUNTER — Other Ambulatory Visit (HOSPITAL_COMMUNITY): Payer: Self-pay

## 2023-07-14 NOTE — Telephone Encounter (Signed)
 Pharmacy Patient Advocate Encounter  Received notification from [facility] that Prior Authorization for UBRELVY 100MG  has been APPROVED from 4.1.25 to 4.1.26. Unable to obtain price due to refill too soon rejection, last fill date 3.20.25 next available fill date4.12.25   PA #/Case ID/Reference #: [id number]

## 2023-11-10 ENCOUNTER — Encounter: Payer: Self-pay | Admitting: Neurology

## 2024-01-11 ENCOUNTER — Ambulatory Visit: Payer: 59 | Admitting: Neurology

## 2024-01-18 ENCOUNTER — Other Ambulatory Visit: Payer: Self-pay | Admitting: Neurology

## 2024-01-18 NOTE — Progress Notes (Unsigned)
 NEUROLOGY FOLLOW UP OFFICE NOTE  ELYSABETH [name] [id number]  Assessment/Plan:   Migraine without aura, without status migrainosus, not intractable Tension-type headache, cervicogenic   PLAN:  For preventative management, Aimovig  140mg  every 28 days.   For abortive therapy, Ubrelvy  100mg    For neck pain, cyclobenzaprine  10mg  at bedtime, take 1-2 times during the day if needed (caution for drowsiness).  Will provide home stretch exercises. If ineffective, refer to physical therapy  Limit use of pain relievers to no more than 9 days out of month to prevent risk of rebound or medication-overuse headache  Keep headache diary Follow up 1 year  Subjective:  [name] is a 63 year old woman with migraines, fibromyalgia vs questionable lupus and depression who follows up for migraines.   UPDATE: Doing well Intensity:  severe Duration:  usually within an hour with Ubrelvy  (rarely uses 2).  Frequency:  usually 1 to 2 a month (last month she had 6 but uncertain why)  Neck pain improved on cyclobenzaprine . She has one tension type headache a week.  Usually lasts a day.  Sometimes takes Tylenol  or naproxen.  She has been seeing a chiropractor for 6 months who has been doing adjustments.  No improvement in headache.  One time, she felt a pop and it scared her.    Frequency of abortive medication: nothing.   Current NSAIDS/analgesics:  naproxen 220mg , Tylenol , tramadol (2 daily for fibromyalgia) Current triptans:  none Current ergotamine:  none Current anti-emetic:  none Current muscle relaxants:  cyclobenzaprine  10mg  1-2 times during day PRN and at bedtime Current sleep aide:  none Current Antihypertensive medications:  none Current Antidepressant medications:  Lexapro 20mg  Current Anticonvulsant medications: none Current anti-CGRP:  Ubrelvy  100mg , Aimovig  140mg  Other therapy:  none Hormone/birth control:  estradiol   Caffeine:  6 oz coffee daily Diet:  Drinks at least 48-64 oz  water daily.  No soda Exercise:  Not as much as she should.   Depression:  yes; Anxiety:  yes Other pain:  Chronic pain Sleep hygiene:  varies   HISTORY:  Onset:  63 years old.  They have been worse over past few minutes.  She has been having a constant pressure headache on top Location:  Usually left sided and behind left eye.   Quality:  pounding Initial intensity:  Severe.  She denies severe or thunderclap headache Aura:  No Premonitory Phase:  Head pressure Postdrome:  Hangover effect for 2 to 3 days Associated symptoms:  Nausea, photophobia, phonophobia, blurred vision (more recent).  Sometimes vomiting.  She denies associated autonomic symptoms and unilateral numbness or weakness. Initial duration:  2 days Initial frequency:  4-5 a month Initial frequency of abortive medication: ibuprofen or naproxen at least once a day Triggers:  No Relieving factors:  Dark and quiet room Activity:  Aggravates.   She also has history of intermittent dizziness..  She describes it as a spinning sensation.  Spontaneous onset.  It typically lasts several days.  They occur once a month.  Movement aggravates it.  Some nausea.  Worse getting up in morning.  When walking, she feels off-balance.     Past NSAIDS:  ibuprofen; naproxen Past analgesics:  Excedrin Migraine Past abortive triptans:  Maxalt; sumatriptan  100mg  Past abortive ergotamine:  none Past muscle relaxants:  none Past anti-emetic:  Phenergan, Zofran  (effective) Past antihypertensive medications:  propranolol  (paresthesias) Past antidepressant medications:  none Past anticonvulsant medications:  topiramate  50mg  at bedtime (side effects), gabapentin  Past anti-CGRP:  none Past vitamins/Herbal/Supplements:  none Past antihistamines/decongestants:  none Other past therapies:  none   Family history of headache:  Sister (migraines), brother (migraines)   MRI of brain without contrast from 07/19/18 showed very mild nonspecific subcortical  and periventricular punctate T2 and FLAIR hyperintense foci.   CT C-spine on 03/17/2020 showed cervical spondylosis with mild bilateral facet arthropathy at C2-3, moderate left facet arthropathy at C4-5, and bilateral facet arthropathy at C6-7  PAST MEDICAL HISTORY: Past Medical History:  Diagnosis Date   Anemia    2002 after having hysterectomy   Anxiety    Chronic pain    Depression    Fibromyalgia    GERD (gastroesophageal reflux disease)    History of kidney stones    Lupus (HCC)    Migraine headache    PONV (postoperative nausea and vomiting)    Sleep apnea    Not enough for a CPAP.    MEDICATIONS: Current Outpatient Medications on File Prior to Visit  Medication Sig Dispense Refill   Cholecalciferol (VITAMIN D PO) Take 1 tablet by mouth daily.     Cyanocobalamin (B-12 COMPLIANCE INJECTION) 1000 MCG/ML KIT Inject as directed. Once a month     cyclobenzaprine  (FLEXERIL ) 10 MG tablet TAKE 1 TABLET(10 MG) BY MOUTH THREE TIMES DAILY AS NEEDED FOR MUSCLE SPASMS 90 tablet 11   Erenumab -aooe (AIMOVIG ) 140 MG/ML SOAJ Inject 140 mg into the skin every 28 (twenty-eight) days. 1.12 mL 11   escitalopram (LEXAPRO) 20 MG tablet Take 20 mg by mouth daily.     estradiol (ESTRACE) 2 MG tablet Take 2 mg by mouth daily.     hydrOXYzine (ATARAX/VISTARIL) 25 MG tablet Take 25 mg by mouth daily.     naproxen sodium (ALEVE) 220 MG tablet Take 220 mg by mouth 2 (two) times daily as needed (Pain). Reported on 06/07/2015     Prenatal Vit-Fe Fumarate-FA (PRENATAL MULTIVITAMIN) TABS tablet Take 1 tablet by mouth daily at 12 noon.     traMADol (ULTRAM) 50 MG tablet Take 50 mg by mouth every 6 (six) hours as needed for pain.     Ubrogepant  (UBRELVY ) 100 MG TABS Take 1 tablet (100 mg total) by mouth as needed (May repeat in 2 hours.  Maximum 2 tablets in 24 hours.). 8 tablet 11   No current facility-administered medications on file prior to visit.    ALLERGIES: Allergies  Allergen Reactions    Penicillins Swelling and Other (See Comments)    Blisters     FAMILY HISTORY: Family History  Problem Relation Age of Onset   Hypertension Mother    Diabetes Father    Hypertension Sister        accidental death-fall, head injury   Cancer Brother        lung   Hypertension Brother    Hypertension Sister    Hypertension Brother    Diabetes Brother    Hypertension Sister    Hypertension Brother    Other Son        suicide      Objective:  Blood pressure (!) 144/75, pulse 87, height 5' 4 (1.626 m), weight 176 lb (79.8 kg), SpO2 97%. General: No acute distress.  Patient appears well-groomed.   Head:  Normocephalic/atraumatic Neck:  Supple.  bilateral paraspinal tenderness.  Full range of motion. Heart:  Regular rate and rhythm. Neuro:  Alert and oriented.  Speech fluent and not dysarthric.  Language intact.  CN II-XII intact.  Bulk and tone normal.  Muscle strength 5/5 throughout.  Sensation  to light touch intact.  Deep tendon reflexes 2+ throughout, toes downgoing.  Gait normal.  Romberg negative.     Juliene Dunnings, DO  CC: Jerilynn Carnes, MD

## 2024-01-19 ENCOUNTER — Encounter: Payer: Self-pay | Admitting: Neurology

## 2024-01-19 ENCOUNTER — Ambulatory Visit: Admitting: Neurology

## 2024-01-19 VITALS — BP 144/75 | HR 87 | Ht 64.0 in | Wt 176.0 lb

## 2024-01-19 DIAGNOSIS — G43009 Migraine without aura, not intractable, without status migrainosus: Secondary | ICD-10-CM | POA: Diagnosis not present

## 2024-01-19 DIAGNOSIS — M542 Cervicalgia: Secondary | ICD-10-CM | POA: Diagnosis not present

## 2024-01-19 DIAGNOSIS — G44219 Episodic tension-type headache, not intractable: Secondary | ICD-10-CM

## 2024-01-19 MED ORDER — AIMOVIG 140 MG/ML ~~LOC~~ SOAJ: 140.0000 mg | SUBCUTANEOUS | 11 refills | Status: AC

## 2024-01-19 MED ORDER — UBRELVY 100 MG PO TABS: 1.0000 | ORAL_TABLET | ORAL | 5 refills | Status: AC | PRN

## 2024-01-19 MED ORDER — CYCLOBENZAPRINE HCL 10 MG PO TABS: ORAL_TABLET | ORAL | 11 refills | Status: AC

## 2024-01-19 NOTE — Patient Instructions (Signed)
 Aimovig  140mg  every 28 days Ubrelvy  as needed Cyclobenzaprine  for neck pain Try neck stretches:  Neck Exercises Ask your health care provider which exercises are safe for you. Do exercises exactly as told by your health care provider and adjust them as directed. It is normal to feel mild stretching, pulling, tightness, or discomfort as you do these exercises. Stop right away if you feel sudden pain or your pain gets worse. Do not begin these exercises until told by your health care provider. Neck exercises can be important for many reasons. They can improve strength and maintain flexibility in your neck, which will help your upper back and prevent neck pain. Stretching exercises Rotation neck stretching  Sit in a chair or stand up. Place your feet flat on the floor, shoulder-width apart. Slowly turn your head (rotate) to the right until a slight stretch is felt. Turn it all the way to the right so you can look over your right shoulder. Do not tilt or tip your head. Hold this position for 10-30 seconds. Slowly turn your head (rotate) to the left until a slight stretch is felt. Turn it all the way to the left so you can look over your left shoulder. Do not tilt or tip your head. Hold this position for 10-30 seconds. Repeat __________ times. Complete this exercise __________ times a day. Neck retraction  Sit in a sturdy chair or stand up. Look straight ahead. Do not bend your neck. Use your fingers to push your chin backward (retraction). Do not bend your neck for this movement. Continue to face straight ahead. If you are doing the exercise properly, you will feel a slight sensation in your throat and a stretch at the back of your neck. Hold the stretch for 1-2 seconds. Repeat __________ times. Complete this exercise __________ times a day. Strengthening exercises Neck press  Lie on your back on a firm bed or on the floor with a pillow under your head. Use your neck muscles to push your  head down on the pillow and straighten your spine. Hold the position as well as you can. Keep your head facing up (in a neutral position) and your chin tucked. Slowly count to 5 while holding this position. Repeat __________ times. Complete this exercise __________ times a day. Isometrics These are exercises in which you strengthen the muscles in your neck while keeping your neck still (isometrics). Sit in a supportive chair and place your hand on your forehead. Keep your head and face facing straight ahead. Do not flex or extend your neck while doing isometrics. Push forward with your head and neck while pushing back with your hand. Hold for 10 seconds. Do the sequence again, this time putting your hand against the back of your head. Use your head and neck to push backward against the hand pressure. Finally, do the same exercise on either side of your head, pushing sideways against the pressure of your hand. Repeat __________ times. Complete this exercise __________ times a day. Prone head lifts  Lie face-down (prone position), resting on your elbows so that your chest and upper back are raised. Start with your head facing downward, near your chest. Position your chin either on or near your chest. Slowly lift your head upward. Lift until you are looking straight ahead. Then continue lifting your head as far back as you can comfortably stretch. Hold your head up for 5 seconds. Then slowly lower it to your starting position. Repeat __________ times. Complete this exercise __________  times a day. Supine head lifts  Lie on your back (supine position), bending your knees to point to the ceiling and keeping your feet flat on the floor. Lift your head slowly off the floor, raising your chin toward your chest. Hold for 5 seconds. Repeat __________ times. Complete this exercise __________ times a day. Scapular retraction  Stand with your arms at your sides. Look straight ahead. Slowly pull both  shoulders (scapulae) backward and downward (retraction) until you feel a stretch between your shoulder blades in your upper back. Hold for 10-30 seconds. Relax and repeat. Repeat __________ times. Complete this exercise __________ times a day. Contact a health care provider if: Your neck pain or discomfort gets worse when you do an exercise. Your neck pain or discomfort does not improve within 2 hours after you exercise. If you have any of these problems, stop exercising right away. Do not do the exercises again unless your health care provider says that you can. Get help right away if: You develop sudden, severe neck pain. If this happens, stop exercising right away. Do not do the exercises again unless your health care provider says that you can. This information is not intended to replace advice given to you by your health care provider. Make sure you discuss any questions you have with your health care provider. Document Revised: 09/24/2020 Document Reviewed: 09/24/2020 Elsevier Patient Education  2024 [employer].

## 2024-02-25 ENCOUNTER — Other Ambulatory Visit: Payer: Self-pay | Admitting: Neurology

## 2024-03-06 NOTE — H&P (Signed)
 Surgical History & Physical  Patient Name: [name]  DOB: 1960-07-13  Surgery: Cataract extraction with intraocular lens implant phacoemulsification; Right Eye Surgeon: Marsa Cleverly MD Surgery Date: 03/14/2024 Pre-Op Date: 02/01/2024  HPI: A 68 Yr. old female patient returning for Cataract Evaluation OU. Pt c/o decreased vision. She describes it as blurry and hazy without improvement from glasses. She also c/o decreased near vision without improvement from glasses. Pt c/o poor night vision and excessive glare from head lights, bright lights, and sunlight. This is negatively affecting the patient's quality of life and the patient is unable to function adequately in life with the current level of vision. HPI was performed by Marsa Cleverly .  Medical History: Dry Eyes Cataracts Lattice Degeneration OD  Fibromyalgia, Lupus: Erythematosus, Depression, Migraines Heart Problem  Review of Systems Allergic/Immunologic Lupus : erythematosus, fibromyalgia Neurological Migraines Psychiatry Depression All recorded systems are negative except as noted above.  Social Never smoked   Medication Refresh artificial tears, Prednisolone-moxiflox-bromfen,  Estradiol, Lexapro, Ultram, Flexeril ,  fluorouracil ,  tramadol ,  estradiol ,  hydroxyzine HCl ,  cyclobenzaprine  ,  doxycycline hyclate ,  furosemide ,  escitalopram oxalate ,  Ubrelvy  , Fluconazole, Pantoprazole  Sx/Procedures Appendectomy, Gallbladder Sx, Heart Cath, Tonsillectomy, Tubal Ligation  Drug Allergies  Penicillin  History & Physical: Heent: cataracts  NECK: supple without bruits LUNGS: lungs clear to auscultation CV: regular rate and rhythm Abdomen: soft and non-tender  Impression & Plan: Assessment: 1.  CATARACT NUCLEAR SCLEROSIS AGE RELATED; Both Eyes (H25.13) 2.  CONJUNCTIVITIS ALLERGIC CHRONIC OTHER ; Both Eyes (H10.45) 3.  Hyperopia ; Left Eye (H52.02) 4.  KERATOCONJUNCTIVITIS SICCA NOT SPECIFIED AS  SJORGRENS; Both Eyes (Y83.776)  Plan: 1.  Cataracts are visually significant and account for the patient's complaints. Discussed all risks, benefits, procedures and recovery, including infection, loss of vision and eye, need for glasses after surgery or additional procedures. Patient understands changing glasses will not improve vision. Patient indicated understanding of procedure. All questions answered. Patient desires to have surgery, recommend phacoemulsification with intraocular lens. Patient to have preliminary testing necessary ([employer]/IOL Master, Mac OCT, TOPO) Educational materials provided:Cataract.  Plan: - Proceed with cataract surgery OD followed by OS - Plan for best distance target with DIB00 - No DM, no fuchs, no prior eye surgery - good dilation - discussed may need glasses due to astigmatism - Dextenza if available  2.  Discussed allergy treatment in detail. Block allergic response: Topical antihistamine for allergic conjunctivitis, oral antihistamine,  3.  Glasses Rx per Dr. Darroll  4.  Dry eye. Mild signs of dry eye at this time. Can use artificial tears QID OU PRN and warm compresses once daily as needed.

## 2024-03-07 ENCOUNTER — Encounter (HOSPITAL_COMMUNITY)
Admission: RE | Admit: 2024-03-07 | Discharge: 2024-03-07 | Disposition: A | Discharge status: Home / Self Care | Source: Ambulatory Visit | Attending: Optometry | Admitting: Optometry

## 2024-03-07 NOTE — Pre-Procedure Instructions (Signed)
 Attempted pre-op phone call. Operator state, call cannot be completed at this Time. Could not leave a message.

## 2024-03-13 ENCOUNTER — Telehealth: Payer: Self-pay | Admitting: Neurology

## 2024-03-13 NOTE — Telephone Encounter (Signed)
 Pt called this afternoon and she needs a refill on prescription  called: Ubrogepant  (UBRELVY ) 100 MG TABS . Thanks

## 2024-03-14 ENCOUNTER — Encounter (HOSPITAL_COMMUNITY): Admission: RE | Payer: Self-pay | Source: Home / Self Care

## 2024-03-14 ENCOUNTER — Ambulatory Visit (HOSPITAL_COMMUNITY): Admission: RE | Admit: 2024-03-14 | Admitting: Optometry

## 2024-03-14 SURGERY — PHACOEMULSIFICATION, CATARACT, WITH IOL INSERTION
Anesthesia: Monitor Anesthesia Care | Laterality: Right

## 2024-03-14 NOTE — Telephone Encounter (Signed)
 Ubrelvy  script sent in October 2025.  Patient advised per pharmacy they will have to order the Ubrelvy  but they do have the script.

## 2024-04-04 ENCOUNTER — Other Ambulatory Visit (HOSPITAL_COMMUNITY)

## 2024-04-11 ENCOUNTER — Ambulatory Visit (HOSPITAL_COMMUNITY): Admit: 2024-04-11 | Admitting: Optometry

## 2024-04-11 SURGERY — PHACOEMULSIFICATION, CATARACT, WITH IOL INSERTION
Anesthesia: Monitor Anesthesia Care | Laterality: Left

## 2024-04-28 ENCOUNTER — Other Ambulatory Visit: Payer: Self-pay | Admitting: Neurology

## 2024-07-11 ENCOUNTER — Ambulatory Visit: Admitting: Cardiology

## 2025-01-18 ENCOUNTER — Ambulatory Visit: Admitting: Neurology
